# Patient Record
Sex: Female | Born: 1985 | Race: Black or African American | Hispanic: No | Marital: Single | Smoking: Former smoker
Health system: Southern US, Community
[De-identification: ages and names within clinical notes are randomized; demographics above are authoritative.]

## PROBLEM LIST (undated history)

## (undated) DIAGNOSIS — Z789 Other specified health status: Secondary | ICD-10-CM

## (undated) HISTORY — PX: TUBAL LIGATION: SHX77

---
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
## 2006-08-16 DIAGNOSIS — J309 Allergic rhinitis, unspecified: Secondary | ICD-10-CM | POA: Insufficient documentation

## 2006-08-17 ENCOUNTER — Encounter (INDEPENDENT_AMBULATORY_CARE_PROVIDER_SITE_OTHER): Payer: Self-pay | Admitting: *Deleted

## 2009-08-25 ENCOUNTER — Ambulatory Visit (HOSPITAL_COMMUNITY): Admission: RE | Admit: 2009-08-25 | Discharge: 2009-08-25 | Payer: Self-pay | Admitting: Obstetrics and Gynecology

## 2010-07-10 ENCOUNTER — Encounter: Payer: Self-pay | Admitting: Obstetrics

## 2010-11-04 NOTE — Op Note (Signed)
Sarah Murray, WENZLER NO.:  [id number]   MEDICAL RECORD NO.:  [id number]                   PATIENT TYPE:  INP   LOCATION:  9107                                 FACILITY:  [facility]   PHYSICIAN:  Conni Elliot, M.D.             DATE OF BIRTH:  [birth year]   DATE OF PROCEDURE:  08/18/2003  DATE OF DISCHARGE:  08/21/2003                                 OPERATIVE REPORT   PREOPERATIVE DIAGNOSES:  1. Repetitive variable fetal heart rate decelerations.  2. Persistent occiput posterior position.  3. Arrest of labor.   POSTOPERATIVE DIAGNOSES:  1. Repetitive variable fetal heart rate decelerations.  2. Persistent occiput posterior position.  3. Arrest of labor.   OPERATION:  Low transverse cesarean.   OPERATOR:  Conni Elliot, M.D., and Toney Rakes, M.D.   OPERATIVE FINDINGS:  Apgars of 8 and 9, cord pH of 7.34.  Placenta was sent  to pathology.   OPERATIVE PROCEDURE:  Patient supine in the left lateral tilt position,  receiving oxygen and was prepped and draped in the usual sterile fashion.  A  low transverse Pfannenstiel incision was made.  Incision was made in the  skin and subcutaneous tissue and fascia, rectus muscles separated in the  midline, peritoneal cavity entered, bladder flap created.  A low transverse  uterine incision was made.  The baby was delivered from a vertex  presentation, cord doubly clamped and cut, and the remainder of the body was  delivered after DeLee suctioning.  Cord was clamped and cut and baby handed  to neonatologist in attendance.  The placenta was delivered spontaneously.  The uterus, bladder flap, anterior  peritoneum, fascia, subcutaneous tissue,  and skin were closed in the routine fashion.  Estimated blood loss was less  than 800 mL.  The instrument count was correct.                                               Conni Elliot, M.D.    ASG/MEDQ  D:  09/17/2003  T:  09/17/2003  Job:  [id number]

## 2010-11-04 NOTE — Discharge Summary (Signed)
NAMELAFAWN, Sarah Murray                             ACCOUNT NO.:  [id number]   MEDICAL RECORD NO.:  [id number]                   PATIENT TYPE:  INP   LOCATION:  9107                                 FACILITY:  [facility]   PHYSICIAN:  Phil D. Okey Dupre, M.D.                  DATE OF BIRTH:  1985/10/05   DATE OF ADMISSION:  08/17/2003  DATE OF DISCHARGE:  08/21/2003                                 DISCHARGE SUMMARY   ADMISSION DIAGNOSIS:  A 25 year old gravida 1 at 41 weeks for induction of  labor secondary to post dates.   DISCHARGE DIAGNOSES:  1. A 25 year old gravida 1 para 1 postoperative day #3 status post lower     transverse cesarean section secondary to nonreassuring fetal heart     tracing and failure to progress.  2. Viable female with Apgars 8 at one minute and 9 at five minutes.   DISCHARGE MEDICATIONS:  1. Percocet 5/325 one to two p.o. q.4-6h. p.r.n.  2. Ibuprofen 600 mg one p.o. q.6h. p.r.n.  3. Depo-Provera 150 mg IM every 3 months.  4. Prenatal vitamins one p.o. daily.  5. Iron sulfate 325 mg one p.o. t.i.d. with meals.  6. Colace 100 mg one p.o. daily.   ADMISSION HISTORY:  Sarah Murray was a G1 who presented at 41 weeks with the  loss of her mucous plug.  She had decided that since she was post dates that  she would be admitted for induction.   HOSPITAL COURSE:  The patient was started on Cervidil.  She received two of  these with adequate cervical ripening.  She was then started on Pitocin per  low-dose protocol for augmentation.  She spontaneously ruptured at 5 cm and  the fluid was noted to be thick meconium.  An amnioinfusion was started.   Although the Pitocin was increased and the patient had adequate MVUs the  patient failed to dilate past 5-6 cm.  The fetal heart tracing began having  repetitive deep variables and it was decided that the patient should be  taken for a lower transverse C-section.  This was explained to the patient  and she was in agreement.  Please see the  operative note for that procedure.   The patient's postpartum course was uneventful.  She was noted to be anemic  with a hemoglobin of 7.9 on postoperative day #1.  She was started on iron.   The patient was in stable condition on day of discharge.  She was given Depo-  Provera injection before discharge.  Her staples were removed.  She is  breast pumping.   She will follow up with the [facility]     Maurice March, M.D.                        Phil D. Okey Dupre, M.D.   LC/MEDQ  D:  08/21/2003  T:  08/22/2003  Job:  [id number]

## 2012-07-15 ENCOUNTER — Emergency Department (HOSPITAL_BASED_OUTPATIENT_CLINIC_OR_DEPARTMENT_OTHER)
Admission: EM | Admit: 2012-07-15 | Discharge: 2012-07-15 | Disposition: A | Discharge status: Home / Self Care | Payer: Self-pay | Attending: Emergency Medicine | Admitting: Emergency Medicine

## 2012-07-15 ENCOUNTER — Encounter (HOSPITAL_BASED_OUTPATIENT_CLINIC_OR_DEPARTMENT_OTHER): Payer: Self-pay | Admitting: *Deleted

## 2012-07-15 DIAGNOSIS — N760 Acute vaginitis: Secondary | ICD-10-CM | POA: Insufficient documentation

## 2012-07-15 DIAGNOSIS — J3489 Other specified disorders of nose and nasal sinuses: Secondary | ICD-10-CM | POA: Insufficient documentation

## 2012-07-15 DIAGNOSIS — B9689 Other specified bacterial agents as the cause of diseases classified elsewhere: Secondary | ICD-10-CM

## 2012-07-15 DIAGNOSIS — Z3202 Encounter for pregnancy test, result negative: Secondary | ICD-10-CM | POA: Insufficient documentation

## 2012-07-15 DIAGNOSIS — R300 Dysuria: Secondary | ICD-10-CM | POA: Insufficient documentation

## 2012-07-15 LAB — URINE MICROSCOPIC-ADD ON

## 2012-07-15 LAB — URINALYSIS, ROUTINE W REFLEX MICROSCOPIC
Bilirubin Urine: NEGATIVE
Protein, ur: NEGATIVE mg/dL
Urobilinogen, UA: 1 mg/dL (ref 0.0–1.0)

## 2012-07-15 LAB — WET PREP, GENITAL: Trich, Wet Prep: NONE SEEN

## 2012-07-15 LAB — PREGNANCY, URINE: Preg Test, Ur: NEGATIVE

## 2012-07-15 MED ORDER — METRONIDAZOLE 500 MG PO TABS: 500.0000 mg | ORAL_TABLET | Freq: Two times a day (BID) | ORAL | Status: DC

## 2012-07-15 NOTE — ED Notes (Signed)
MD at bedside, Dr. Karma Ganja.

## 2012-07-15 NOTE — ED Notes (Signed)
Pt c/o UTi symptoms x 2 months , vaginal discharge x 1 week and sinus pressure

## 2012-07-15 NOTE — ED Notes (Signed)
MD at bedside. 

## 2012-07-15 NOTE — ED Provider Notes (Signed)
History     CSN: [id number]  Arrival date & time 07/15/12  1644   First MD Initiated Contact with Patient 07/15/12 1656      Chief Complaint  Patient presents with  . Urinary Tract Infection    (Consider location/radiation/quality/duration/timing/severity/associated sxs/prior treatment) Patient is a 27 y.o. female presenting with urinary tract infection. The history is provided by the patient.  Urinary Tract Infection This is a recurrent problem. The current episode started 1 to 4 weeks ago. The problem occurs constantly. The problem has been waxing and waning. Associated symptoms include congestion. Pertinent negatives include no abdominal pain, coughing, fatigue, fever, myalgias, nausea or rash. Nothing aggravates the symptoms. She has tried nothing for the symptoms.    History reviewed. No pertinent past medical history.  Past Surgical History  Procedure Date  . Tubal ligation   . Cesarean section     History reviewed. No pertinent family history.  History  Substance Use Topics  . Smoking status: Never Smoker   . Smokeless tobacco: Not on file  . Alcohol Use: No    OB History    Grav Para Term Preterm Abortions TAB SAB Ect Mult Living                  Review of Systems  Constitutional: Negative for fever and fatigue.  HENT: Positive for congestion, rhinorrhea and sneezing.   Respiratory: Negative for cough and shortness of breath.   Gastrointestinal: Negative for nausea and abdominal pain.  Genitourinary: Positive for dysuria and vaginal discharge (normal discharge, however has odor).  Musculoskeletal: Negative for myalgias.  Skin: Negative for rash.  All other systems reviewed and are negative.    Allergies  Review of patient's allergies indicates no known allergies.  Home Medications  No current outpatient prescriptions on file.  BP 98/66  Pulse 112  Temp 98.4 F (36.9 C) (Oral)  Resp 16  Ht 5\' 5"  (1.651 m)  Wt 173 lb (78.472 kg)  BMI 28.79  kg/m2  SpO2 100%  LMP 07/15/2012  Physical Exam  Nursing note and vitals reviewed. Constitutional: She is oriented to person, place, and time. She appears well-developed and well-nourished. No distress.  HENT:  Head: Normocephalic and atraumatic.  Mouth/Throat: No oropharyngeal exudate or posterior oropharyngeal erythema.  Eyes: EOM are normal. Pupils are equal, round, and reactive to light.  Neck: Normal range of motion. Neck supple.  Cardiovascular: Normal rate and regular rhythm.  Exam reveals no friction rub.   No murmur heard. Pulmonary/Chest: Effort normal and breath sounds normal. No respiratory distress. She has no wheezes. She has no rales.  Abdominal: Soft. She exhibits no distension. There is no tenderness. There is no rebound.  Musculoskeletal: Normal range of motion. She exhibits no edema.  Neurological: She is alert and oriented to person, place, and time.  Skin: She is not diaphoretic.    ED Course  Procedures (including critical care time)  Labs Reviewed  URINALYSIS, ROUTINE W REFLEX MICROSCOPIC - Abnormal; Notable for the following:    Hgb urine dipstick TRACE (*)     Leukocytes, UA SMALL (*)     All other components within normal limits  URINE MICROSCOPIC-ADD ON - Abnormal; Notable for the following:    Bacteria, UA FEW (*)     All other components within normal limits  PREGNANCY, URINE  URINE CULTURE   No results found.   1. Bacterial vaginosis       MDM   27 year old female presents with UTI symptoms  and URI symptoms. UTI symptoms have been present for the last 10 days. States some frequency and some dysuria. Has suprapubic pain with urination. Denies hematuria. Denies fevers or back pain. No abdominal pain or vomiting. I will check her urine. She is also complaining of some odor to her normal vaginal discharge. I will do pelvic exam. Patient also complaining of upper respiratory type symptoms. She states congestion, sneezing, rhinorrhea for the past  2 weeks. Denies any cough or fevers. Patient HEENT exam is normal. Normal TMs, normal pharynx. No nasal tendernI.ess to palpation. Patient's symptoms do a cold. Will recommend supportive care.  Urine negative for UTI. Patient has some clue cells on her wet prep. I gave Flagyl. Stable for discharge.       Elwin Mocha, MD 07/16/12 [phone]

## 2012-07-17 LAB — URINE CULTURE: Colony Count: >=100000

## 2012-07-18 ENCOUNTER — Telehealth (HOSPITAL_COMMUNITY): Payer: Self-pay | Admitting: Emergency Medicine

## 2012-07-18 NOTE — ED Notes (Signed)
Results received from [facility]. (+) URNC -> >/= 100,000 colonies, E Coli.  No abx Rx given in ED.  Chart to MD office for review.

## 2012-07-19 NOTE — ED Notes (Signed)
Chart returned from EDP office   With rx written  By Lemont Fillers for Keflex 500 mg TAB # 14 one tab po BID x 7 days needs to be called to pharmacy.

## 2012-07-19 NOTE — ED Provider Notes (Signed)
I saw and evaluated the patient, reviewed the resident's note and I agree with the findings and plan.  Martha K Linker, MD 07/19/12 1120 

## 2012-07-20 ENCOUNTER — Telehealth (HOSPITAL_COMMUNITY): Payer: Self-pay | Admitting: Emergency Medicine

## 2012-07-21 NOTE — ED Notes (Signed)
Unable to contact patient via phone. Sent letter. °

## 2012-11-24 ENCOUNTER — Encounter (HOSPITAL_BASED_OUTPATIENT_CLINIC_OR_DEPARTMENT_OTHER): Payer: Self-pay | Admitting: *Deleted

## 2012-11-24 ENCOUNTER — Emergency Department (HOSPITAL_BASED_OUTPATIENT_CLINIC_OR_DEPARTMENT_OTHER)
Admission: EM | Admit: 2012-11-24 | Discharge: 2012-11-24 | Disposition: A | Discharge status: Home / Self Care | Payer: Self-pay | Attending: Emergency Medicine | Admitting: Emergency Medicine

## 2012-11-24 DIAGNOSIS — K1370 Unspecified lesions of oral mucosa: Secondary | ICD-10-CM | POA: Insufficient documentation

## 2012-11-24 DIAGNOSIS — L237 Allergic contact dermatitis due to plants, except food: Secondary | ICD-10-CM

## 2012-11-24 DIAGNOSIS — L255 Unspecified contact dermatitis due to plants, except food: Secondary | ICD-10-CM | POA: Insufficient documentation

## 2012-11-24 DIAGNOSIS — L299 Pruritus, unspecified: Secondary | ICD-10-CM | POA: Insufficient documentation

## 2012-11-24 DIAGNOSIS — K148 Other diseases of tongue: Secondary | ICD-10-CM

## 2012-11-24 MED ORDER — PREDNISONE 10 MG PO TABS: 20.0000 mg | ORAL_TABLET | Freq: Every day | ORAL | Status: DC

## 2012-11-24 MED ORDER — PREDNISONE 50 MG PO TABS
60.0000 mg | ORAL_TABLET | Freq: Once | ORAL | Status: AC
  Administered 2012-11-24: 60 mg via ORAL
  Filled 2012-11-24: qty 1

## 2012-11-24 NOTE — ED Notes (Signed)
Patient has white bumps on her tongue and hands. She states that her hands are itching and the inside of her mouth is irritated. Denies any other symptoms. Noticed yesterday.

## 2012-11-24 NOTE — ED Provider Notes (Signed)
History     CSN: [id number]  Arrival date & time 11/24/12  1235   First MD Initiated Contact with Patient 11/24/12 1315      Chief Complaint  Patient presents with  . Rash    (Consider location/radiation/quality/duration/timing/severity/associated sxs/prior treatment) Patient is a 27 y.o. female presenting with rash.  Rash Pain location: hands. Pain radiates to:  Does not radiate Pain severity:  No pain Onset quality:  Gradual Timing:  Constant Progression:  Unchanged Chronicity:  New Context: not alcohol use, not awakening from sleep, not diet changes, not previous surgeries and not recent illness   Relieved by:  Nothing Worsened by:  Nothing tried Ineffective treatments:  None tried Associated symptoms comment:  Itches   History reviewed. No pertinent past medical history.  Past Surgical History  Procedure Laterality Date  . Tubal ligation    . Cesarean section      No family history on file.  History  Substance Use Topics  . Smoking status: Never Smoker   . Smokeless tobacco: Not on file  . Alcohol Use: No    OB History   Grav Para Term Preterm Abortions TAB SAB Ect Mult Living                  Review of Systems  Skin: Positive for rash.  All other systems reviewed and are negative.    Allergies  Review of patient's allergies indicates no known allergies.  Home Medications   Current Outpatient Rx  Name  Route  Sig  Dispense  Refill  . metroNIDAZOLE (FLAGYL) 500 MG tablet   Oral   Take 1 tablet (500 mg total) by mouth 2 (two) times daily.   14 tablet   0     BP 105/71  Pulse 72  Temp(Src) 98.3 F (36.8 C) (Oral)  Resp 18  Ht 5\' 5"  (1.651 m)  Wt 191 lb (86.637 kg)  BMI 31.78 kg/m2  SpO2 97%  LMP 11/18/2012  Physical Exam  Nursing note and vitals reviewed. Constitutional: She appears well-developed and well-nourished.  HENT:  Head: Normocephalic and atraumatic.  Clear small vesicles on tongue  Eyes: Conjunctivae are normal.  Pupils are equal, round, and reactive to light.  Neck: Normal range of motion. Neck supple.  Cardiovascular: Normal rate and regular rhythm.   Pulmonary/Chest: Effort normal and breath sounds normal.  Abdominal: Soft. Bowel sounds are normal.  Skin: Skin is warm, dry and intact. Rash noted. Rash is maculopapular. No cyanosis. Nails show no clubbing.    ED Course  Procedures (including critical care time)  Labs Reviewed - No data to display No results found.   No diagnosis found.    MDM  Patient placed on prednisone for contact dermatitis on hands.  Tongue vesicles may be viral advised f/y if not resolved.        Hilario Quarry, MD 11/25/12 [phone]

## 2012-11-24 NOTE — ED Notes (Signed)
Patient has small raised bumps on her hands that she states itches and burns. She also has tiny raised bumps along the sides of her tongue.

## 2013-02-21 ENCOUNTER — Emergency Department (HOSPITAL_BASED_OUTPATIENT_CLINIC_OR_DEPARTMENT_OTHER)
Admission: EM | Admit: 2013-02-21 | Discharge: 2013-02-21 | Disposition: A | Discharge status: Home / Self Care | Payer: Self-pay | Attending: Emergency Medicine | Admitting: Emergency Medicine

## 2013-02-21 ENCOUNTER — Encounter (HOSPITAL_BASED_OUTPATIENT_CLINIC_OR_DEPARTMENT_OTHER): Payer: Self-pay | Admitting: *Deleted

## 2013-02-21 DIAGNOSIS — Z3202 Encounter for pregnancy test, result negative: Secondary | ICD-10-CM | POA: Insufficient documentation

## 2013-02-21 DIAGNOSIS — Z9851 Tubal ligation status: Secondary | ICD-10-CM | POA: Insufficient documentation

## 2013-02-21 DIAGNOSIS — R10819 Abdominal tenderness, unspecified site: Secondary | ICD-10-CM | POA: Insufficient documentation

## 2013-02-21 DIAGNOSIS — N390 Urinary tract infection, site not specified: Secondary | ICD-10-CM | POA: Insufficient documentation

## 2013-02-21 DIAGNOSIS — R300 Dysuria: Secondary | ICD-10-CM | POA: Insufficient documentation

## 2013-02-21 LAB — URINALYSIS, ROUTINE W REFLEX MICROSCOPIC
Nitrite: NEGATIVE
Protein, ur: 100 mg/dL — AB
Urobilinogen, UA: 0.2 mg/dL (ref 0.0–1.0)

## 2013-02-21 LAB — PREGNANCY, URINE: Preg Test, Ur: NEGATIVE

## 2013-02-21 MED ORDER — SULFAMETHOXAZOLE-TRIMETHOPRIM 800-160 MG PO TABS: 1.0000 | ORAL_TABLET | Freq: Two times a day (BID) | ORAL | Status: DC

## 2013-02-21 MED ORDER — SULFAMETHOXAZOLE-TMP DS 800-160 MG PO TABS
1.0000 | ORAL_TABLET | Freq: Once | ORAL | Status: AC
  Administered 2013-02-21: 1 via ORAL
  Filled 2013-02-21: qty 1

## 2013-02-21 MED ORDER — PHENAZOPYRIDINE HCL 200 MG PO TABS: 200.0000 mg | ORAL_TABLET | Freq: Three times a day (TID) | ORAL | Status: DC

## 2013-02-21 NOTE — ED Notes (Signed)
Pt reports symptoms of uti states frequency, urgency,painful urination as well as bad odor to urine. Pt reports that the symptoms started after having sex with husband . She has no vaginal discharge, no rash or itching. Denies abdominal pain or back pain . Denies fever and chills

## 2013-02-21 NOTE — ED Provider Notes (Signed)
CSN: [id number]     Arrival date & time 02/21/13  7829 History   First MD Initiated Contact with Patient 02/21/13 [phone]     Chief Complaint  Patient presents with  . possible uti    (Consider location/radiation/quality/duration/timing/severity/associated sxs/prior Treatment) HPI Patient presents with 4 days of urinary frequency, pressure sensation in her suprapubic area, dysuria, malodorous urine. Onset was gradual.  Since onset symptoms have been persistent. Prior to the onset of symptoms, the patient had intercourse with her husband. No current vaginal bleeding or discharge, current other abdominal pain, the verge, chills, nausea, vomiting, diarrhea. No relief with anything. No clear exacerbating factors.   History reviewed. No pertinent past medical history. Past Surgical History  Procedure Laterality Date  . Tubal ligation    . Cesarean section     History reviewed. No pertinent family history. History  Substance Use Topics  . Smoking status: Never Smoker   . Smokeless tobacco: Not on file  . Alcohol Use: No   OB History   Grav Para Term Preterm Abortions TAB SAB Ect Mult Living                 Review of Systems  All other systems reviewed and are negative.    Allergies  Review of patient's allergies indicates no known allergies.  Home Medications   Current Outpatient Rx  Name  Route  Sig  Dispense  Refill  . metroNIDAZOLE (FLAGYL) 500 MG tablet   Oral   Take 1 tablet (500 mg total) by mouth 2 (two) times daily.   14 tablet   0   . predniSONE (DELTASONE) 10 MG tablet   Oral   Take 2 tablets (20 mg total) by mouth daily.   15 tablet   0    BP 116/68  Pulse 59  Temp(Src) 98.5 F (36.9 C) (Oral)  Resp 20  SpO2 100% Physical Exam  Nursing note and vitals reviewed. Constitutional: She is oriented to person, place, and time. She appears well-developed and well-nourished. No distress.  HENT:  Head: Normocephalic and atraumatic.  Eyes: Conjunctivae and  EOM are normal.  Cardiovascular: Normal rate and regular rhythm.   Pulmonary/Chest: Effort normal and breath sounds normal. No stridor. No respiratory distress.  Abdominal: She exhibits no distension. There is tenderness in the suprapubic area. There is no rigidity, no rebound and no guarding.  Musculoskeletal: She exhibits no edema.  Neurological: She is alert and oriented to person, place, and time. No cranial nerve deficit.  Skin: Skin is warm and dry.  Psychiatric: She has a normal mood and affect.    ED Course  Procedures (including critical care time) Labs Review Labs Reviewed  URINALYSIS, ROUTINE W REFLEX MICROSCOPIC  PREGNANCY, URINE   Imaging Review No results found.   Update: No acute changes during the patient's emergency department monitoring MDM  No diagnosis found. This generally well-appearing female presents in in no distress with concern of dysuria, suprapubic pressure.  Absent pregnancy, vaginal bleeding, significant unilateral lower abdominal pain, pelvic exams not performed.  Patient's laboratory results are consistent with UTI.  She was started on therapy, discharged in stable condition. Absent distress, fever, significant abdominal pain, there is little suspicion for TOA or other notable intra-abdominal pathology currently.    Gerhard Munch, MD 02/21/13 [phone]

## 2013-02-24 LAB — URINE CULTURE

## 2013-02-25 NOTE — ED Notes (Addendum)
Post ED Visit - Positive Culture Follow-up: Successful Patient Follow-Up  Culture assessed and recommendations reviewed by: [x]  Wes Dulaney, Pharm.D., BCPS []  Celedonio Miyamoto, Pharm.D., BCPS []  Georgina Pillion, Pharm.D., BCPS []  [place], [address].D., BCPS, AAHIVP []  Estella Husk, Pharm.D., BCPS, AAHIVP  Positive Urine culture  []  Patient discharged without antimicrobial prescription and treatment is now indicated [x]  Organism is resistant to prescribed ED discharge antimicrobial []  Patient with positive blood cultures  Changes discussed with ED provider:Josh Geiple New antibiotic prescription stop Bactrim/ Start Keflex 500 mg po BID x 7 days  Patient notified and wants rx called to [facility] [phone] in [facility]    Larena Sox 02/25/2013, 4:18 PM

## 2013-02-25 NOTE — Progress Notes (Signed)
  ED Antimicrobial Stewardship Positive Culture Follow Up   Sarah Murray is an 27 y.o. female who presented to [facility] on 02/21/2013 with a chief complaint of  Chief Complaint  Patient presents with  . possible uti     Recent Results (from the past 720 hour(s))  URINE CULTURE     Status: None   Collection Time    02/21/13  8:01 AM      Result Value Range Status   Specimen Description URINE, CLEAN CATCH   Final   Special Requests NONE   Final   Culture  Setup Time     Final   Value: 02/21/2013 17:35     Performed at [facility] Count     Final   Value: >=100,000 COLONIES/ML     Performed at [facility]   Culture     Final   Value: ESCHERICHIA COLI     Performed at [facility]   Report Status 02/24/2013 FINAL   Final   Organism ID, Bacteria ESCHERICHIA COLI   Final    [x]  Treated with Bactrim, organism resistant to prescribed antimicrobial []  Patient discharged originally without antimicrobial agent and treatment is now indicated  New antibiotic prescription: Keflex 500mg  PO BID x 7 days  ED Provider: Rhea Bleacher, PA-C   [name] 02/25/2013, 4:47 PM Infectious Diseases Pharmacist Phone# [phone]

## 2013-03-22 ENCOUNTER — Emergency Department (HOSPITAL_BASED_OUTPATIENT_CLINIC_OR_DEPARTMENT_OTHER)
Admission: EM | Admit: 2013-03-22 | Discharge: 2013-03-22 | Disposition: A | Discharge status: Home / Self Care | Payer: Self-pay | Attending: Emergency Medicine | Admitting: Emergency Medicine

## 2013-03-22 ENCOUNTER — Encounter (HOSPITAL_BASED_OUTPATIENT_CLINIC_OR_DEPARTMENT_OTHER): Payer: Self-pay | Admitting: Emergency Medicine

## 2013-03-22 DIAGNOSIS — N390 Urinary tract infection, site not specified: Secondary | ICD-10-CM | POA: Diagnosis present

## 2013-03-22 DIAGNOSIS — Z3202 Encounter for pregnancy test, result negative: Secondary | ICD-10-CM | POA: Insufficient documentation

## 2013-03-22 DIAGNOSIS — IMO0002 Reserved for concepts with insufficient information to code with codable children: Secondary | ICD-10-CM | POA: Insufficient documentation

## 2013-03-22 DIAGNOSIS — Z79899 Other long term (current) drug therapy: Secondary | ICD-10-CM | POA: Insufficient documentation

## 2013-03-22 DIAGNOSIS — Z792 Long term (current) use of antibiotics: Secondary | ICD-10-CM | POA: Insufficient documentation

## 2013-03-22 LAB — URINE MICROSCOPIC-ADD ON

## 2013-03-22 LAB — URINALYSIS, ROUTINE W REFLEX MICROSCOPIC
Bilirubin Urine: NEGATIVE
Ketones, ur: NEGATIVE mg/dL
Nitrite: NEGATIVE
Urobilinogen, UA: 0.2 mg/dL (ref 0.0–1.0)
pH: 6 (ref 5.0–8.0)

## 2013-03-22 LAB — PREGNANCY, URINE: Preg Test, Ur: NEGATIVE

## 2013-03-22 MED ORDER — CIPROFLOXACIN HCL 250 MG PO TABS: 250.0000 mg | ORAL_TABLET | Freq: Two times a day (BID) | ORAL | Status: DC

## 2013-03-22 NOTE — ED Notes (Signed)
Pt having dysuria x 4 days.  No back pain or fever.

## 2013-03-22 NOTE — ED Provider Notes (Signed)
CSN: [id number]     Arrival date & time 03/22/13  0810 History   First MD Initiated Contact with Patient 03/22/13 [phone]     Chief Complaint  Patient presents with  . Dysuria   (Consider location/radiation/quality/duration/timing/severity/associated sxs/prior Treatment) Patient is a 27 y.o. female presenting with dysuria. The history is provided by the patient.  Dysuria Pain quality:  Burning Pain severity:  Mild Onset quality:  Sudden Duration:  4 days Timing:  Constant Progression:  Unchanged Chronicity:  Recurrent Recent urinary tract infections: yes (2 mos ago)   Relieved by:  Antibiotics Worsened by:  Nothing tried Ineffective treatments:  None tried Urinary symptoms: frequent urination   Associated symptoms: abdominal pain   Associated symptoms: no fever, no flank pain, no nausea and no vomiting     History reviewed. No pertinent past medical history. Past Surgical History  Procedure Laterality Date  . Tubal ligation    . Cesarean section     History reviewed. No pertinent family history. History  Substance Use Topics  . Smoking status: Never Smoker   . Smokeless tobacco: Not on file  . Alcohol Use: No   OB History   Grav Para Term Preterm Abortions TAB SAB Ect Mult Living                 Review of Systems  Constitutional: Negative for fever and fatigue.  HENT: Negative for congestion, drooling and neck pain.   Eyes: Negative for pain.  Respiratory: Negative for cough and shortness of breath.   Cardiovascular: Negative for chest pain.  Gastrointestinal: Positive for abdominal pain. Negative for nausea, vomiting and diarrhea.  Genitourinary: Positive for dysuria. Negative for hematuria and flank pain.  Musculoskeletal: Negative for back pain and gait problem.  Skin: Negative for color change.  Neurological: Negative for dizziness and headaches.  Hematological: Negative for adenopathy.  Psychiatric/Behavioral: Negative for behavioral problems.  All other  systems reviewed and are negative.    Allergies  Review of patient's allergies indicates no known allergies.  Home Medications   Current Outpatient Rx  Name  Route  Sig  Dispense  Refill  . metroNIDAZOLE (FLAGYL) 500 MG tablet   Oral   Take 1 tablet (500 mg total) by mouth 2 (two) times daily.   14 tablet   0   . phenazopyridine (PYRIDIUM) 200 MG tablet   Oral   Take 1 tablet (200 mg total) by mouth 3 (three) times daily.   6 tablet   0   . predniSONE (DELTASONE) 10 MG tablet   Oral   Take 2 tablets (20 mg total) by mouth daily.   15 tablet   0    BP 104/60  Pulse 82  Temp(Src) 98.1 F (36.7 C) (Oral)  Resp 16  Ht 5\' 4"  (1.626 m)  SpO2 100%  LMP 02/20/2013 Physical Exam  Nursing note and vitals reviewed. Constitutional: She is oriented to person, place, and time. She appears well-developed and well-nourished.  HENT:  Head: Normocephalic.  Mouth/Throat: No oropharyngeal exudate.  Eyes: Conjunctivae and EOM are normal. Pupils are equal, round, and reactive to light.  Neck: Normal range of motion. Neck supple.  Cardiovascular: Normal rate, regular rhythm, normal heart sounds and intact distal pulses.  Exam reveals no gallop and no friction rub.   No murmur heard. Pulmonary/Chest: Effort normal and breath sounds normal. No respiratory distress. She has no wheezes.  Abdominal: Soft. Bowel sounds are normal. There is tenderness (mild suprapubic ttp). There is no rebound  and no guarding.  Musculoskeletal: Normal range of motion. She exhibits no edema and no tenderness.  Neurological: She is alert and oriented to person, place, and time.  Skin: Skin is warm and dry.  Psychiatric: She has a normal mood and affect. Her behavior is normal.    ED Course  Procedures (including critical care time) Labs Review Labs Reviewed  URINALYSIS, ROUTINE W REFLEX MICROSCOPIC - Abnormal; Notable for the following:    APPearance TURBID (*)    Hgb urine dipstick MODERATE (*)     Protein, ur 30 (*)    Leukocytes, UA LARGE (*)    All other components within normal limits  URINE MICROSCOPIC-ADD ON - Abnormal; Notable for the following:    Bacteria, UA MANY (*)    All other components within normal limits  URINE CULTURE  PREGNANCY, URINE   Imaging Review No results found.  MDM   1. UTI (lower urinary tract infection)    8:40 AM 27 y.o. female who presents with dysuria and frequency for approximately 4 days. She denies any fevers, vomiting, diarrhea, or back pain. She notes mild suprapubic pain. She states that her symptoms are consistent with previous urinary tract infections. She states that she gets UTIs frequently. She is afebrile and vital signs are unremarkable here. Will send urine and get urine culture as well. I suspect patient needs nonemergent ultrasound of her renal and urinary collecting system. Will provide resources to the patient to followup and get this.  9:30 AM: Pt found to have UTI, will send cx.  I have discussed the diagnosis/risks/treatment options with the patient and believe the pt to be eligible for discharge home to follow-up and establish with a pcp. We also discussed returning to the ED immediately if new or worsening sx occur. We discussed the sx which are most concerning (e.g., fever, back pain, continued sx despite tx) that necessitate immediate return. Any new prescriptions provided to the patient are listed below.  Discharge Medication List as of 03/22/2013  9:34 AM    START taking these medications   Details  ciprofloxacin (CIPRO) 250 MG tablet Take 1 tablet (250 mg total) by mouth every 12 (twelve) hours., Starting 03/22/2013, Until Discontinued, Print         Junius Argyle, MD 03/22/13 [phone]

## 2013-03-24 LAB — URINE CULTURE: Colony Count: >=100000

## 2013-12-24 ENCOUNTER — Emergency Department (HOSPITAL_BASED_OUTPATIENT_CLINIC_OR_DEPARTMENT_OTHER)
Admission: EM | Admit: 2013-12-24 | Discharge: 2013-12-25 | Disposition: A | Discharge status: Home / Self Care | Payer: Self-pay | Attending: Emergency Medicine | Admitting: Emergency Medicine

## 2013-12-24 ENCOUNTER — Encounter (HOSPITAL_BASED_OUTPATIENT_CLINIC_OR_DEPARTMENT_OTHER): Payer: Self-pay | Admitting: Emergency Medicine

## 2013-12-24 DIAGNOSIS — Z792 Long term (current) use of antibiotics: Secondary | ICD-10-CM | POA: Insufficient documentation

## 2013-12-24 DIAGNOSIS — A598 Trichomoniasis of other sites: Secondary | ICD-10-CM | POA: Insufficient documentation

## 2013-12-24 DIAGNOSIS — F17200 Nicotine dependence, unspecified, uncomplicated: Secondary | ICD-10-CM | POA: Insufficient documentation

## 2013-12-24 DIAGNOSIS — A599 Trichomoniasis, unspecified: Secondary | ICD-10-CM

## 2013-12-24 DIAGNOSIS — IMO0002 Reserved for concepts with insufficient information to code with codable children: Secondary | ICD-10-CM | POA: Insufficient documentation

## 2013-12-24 DIAGNOSIS — Z3202 Encounter for pregnancy test, result negative: Secondary | ICD-10-CM | POA: Insufficient documentation

## 2013-12-24 DIAGNOSIS — N390 Urinary tract infection, site not specified: Secondary | ICD-10-CM | POA: Insufficient documentation

## 2013-12-24 DIAGNOSIS — Z79899 Other long term (current) drug therapy: Secondary | ICD-10-CM | POA: Insufficient documentation

## 2013-12-24 LAB — URINALYSIS, ROUTINE W REFLEX MICROSCOPIC
Glucose, UA: NEGATIVE mg/dL
Ketones, ur: 15 mg/dL — AB
NITRITE: NEGATIVE
Specific Gravity, Urine: 1.029 (ref 1.005–1.030)
UROBILINOGEN UA: 0.2 mg/dL (ref 0.0–1.0)
pH: 6 (ref 5.0–8.0)

## 2013-12-24 LAB — URINE MICROSCOPIC-ADD ON

## 2013-12-24 NOTE — ED Notes (Signed)
Pt reports that she started menstral cycle today, developed pain with urination, but states that she feels like her "insides are itching". Recently seen at [facility] for uri and given amoxicillian which patient states she just completed

## 2013-12-24 NOTE — ED Provider Notes (Signed)
CSN: [id number]     Arrival date & time 12/24/13  2309 History  This chart was scribed for Sarah Murray Strummer Canipe, MD by Nicholos Johnsenise Iheanachor, ED scribe. This patient was seen in room MH02/MH02 and the patient's care was started at 11:40 PM.     Chief Complaint  Patient presents with  . Dysuria   HPI HPI Comments: Sarah Murray is a 28 y.o. female w/ hx of bladder infection and UTI presents to the Emergency Department complaining of white vaginal discharge w/ odor, urinary urgency, and vaginal itching. Reports normal white vaginal discharge but states odor started 2 days ago which is abnormal. Also states she feels like her bladder is always full and cannot completely empty. Menstrual cycle started 24 hours ago; normal and on time. Pt is sexually active; does not use condoms. Pt has had 4 pregnancies; 1 still birth, 3 cesarean sections. Denies fever, chills, nausea, vomiting, or dysuria.  History reviewed. No pertinent past medical history. Past Surgical History  Procedure Laterality Date  . Tubal ligation    . Cesarean section     History reviewed. No pertinent family history. History  Substance Use Topics  . Smoking status: Current Every Day Smoker    Types: Cigarettes  . Smokeless tobacco: Not on file  . Alcohol Use: No   OB History   Grav Para Term Preterm Abortions TAB SAB Ect Mult Living                 Review of Systems  Constitutional: Negative for fever and chills.  Gastrointestinal: Negative for nausea and vomiting.  Genitourinary: Positive for urgency, frequency, vaginal bleeding and vaginal discharge. Negative for dysuria, flank pain, difficulty urinating, menstrual problem and pelvic pain.  Musculoskeletal: Negative for back pain and myalgias.  Skin: Negative for rash and wound.  All other systems reviewed and are negative.  Allergies  Review of patient's allergies indicates no known allergies.  Home Medications   Prior to Admission medications   Medication Sig Start Date  End Date Taking? Authorizing Provider  phenazopyridine (PYRIDIUM) 200 MG tablet Take 1 tablet (200 mg total) by mouth 3 (three) times daily. 02/21/13  Yes Gerhard Munchobert Lockwood, MD  ciprofloxacin (CIPRO) 250 MG tablet Take 1 tablet (250 mg total) by mouth every 12 (twelve) hours. 03/22/13   Junius ArgyleForrest S Harrison, MD  metroNIDAZOLE (FLAGYL) 500 MG tablet Take 1 tablet (500 mg total) by mouth 2 (two) times daily. 07/15/12   Dagmar HaitWilliam Blair Walden, MD  predniSONE (DELTASONE) 10 MG tablet Take 2 tablets (20 mg total) by mouth daily. 11/24/12   Hilario Quarryanielle S Ray, MD   Triage vitals: BP 117/84  Pulse 61  Temp(Src) 98.1 F (36.7 C) (Oral)  Resp 18  Ht 5\' 4"  (1.626 m)  Wt 165 lb (74.844 kg)  BMI 28.31 kg/m2  SpO2 100%  LMP 12/22/2013  Physical Exam  Nursing note and vitals reviewed. Constitutional: She is oriented to person, place, and time. She appears well-developed and well-nourished. No distress.  HENT:  Head: Normocephalic and atraumatic.  Mouth/Throat: Oropharynx is clear and moist.  Eyes: Conjunctivae and EOM are normal. Pupils are equal, round, and reactive to light.  Neck: Normal range of motion. Neck supple. No tracheal deviation present.  Cardiovascular: Normal rate and regular rhythm.   Pulmonary/Chest: Effort normal and breath sounds normal. No respiratory distress. She has no wheezes. She has no rales.  Abdominal: Soft. Bowel sounds are normal. She exhibits no distension and no mass. There is no tenderness. There is  no rebound and no guarding.  Genitourinary: No vaginal discharge found.  Blood in vaginal vault. No appreciated discharge. No cervical motion tenderness.  Musculoskeletal: Normal range of motion. She exhibits no edema and no tenderness.  No CVA tenderness.  Neurological: She is alert and oriented to person, place, and time.  Skin: Skin is warm and dry. No rash noted. No erythema.  Psychiatric: She has a normal mood and affect. Her behavior is normal.    ED Course  Procedures  (including critical care time) DIAGNOSTIC STUDIES: Oxygen Saturation is 100% on room air, normal by my interpretation.    COORDINATION OF CARE: At 11:44 PM: Discussed treatment plan with patient which includes pelvic exam. Patient agrees.   Labs Review Labs Reviewed  URINALYSIS, ROUTINE W REFLEX MICROSCOPIC    Imaging Review No results found.   EKG Interpretation None      MDM   Final diagnoses:  None    I personally performed the services described in this documentation, which was scribed in my presence. The recorded information has been reviewed and is accurate.   Will treat for UTI and trichomonal infection. Patient advised to followup with the health department. Term precautions given.  Sarah Murray Salwa Bai, MD 12/25/13 [phone]

## 2013-12-25 LAB — HIV ANTIBODY (ROUTINE TESTING W REFLEX): HIV: NONREACTIVE

## 2013-12-25 LAB — WET PREP, GENITAL
CLUE CELLS WET PREP: NONE SEEN
WBC WET PREP: NONE SEEN
Yeast Wet Prep HPF POC: NONE SEEN

## 2013-12-25 LAB — PREGNANCY, URINE: Preg Test, Ur: NEGATIVE

## 2013-12-25 LAB — GC/CHLAMYDIA PROBE AMP
CT Probe RNA: NEGATIVE
GC Probe RNA: NEGATIVE

## 2013-12-25 MED ORDER — CIPROFLOXACIN HCL 500 MG PO TABS
500.0000 mg | ORAL_TABLET | Freq: Once | ORAL | Status: AC
Start: 2013-12-25 — End: 2013-12-25
  Administered 2013-12-25: 500 mg via ORAL
  Filled 2013-12-25: qty 1

## 2013-12-25 MED ORDER — METRONIDAZOLE 500 MG PO TABS: 500.0000 mg | ORAL_TABLET | Freq: Two times a day (BID) | ORAL | Status: DC

## 2013-12-25 MED ORDER — CIPROFLOXACIN HCL 250 MG PO TABS: 250.0000 mg | ORAL_TABLET | Freq: Two times a day (BID) | ORAL | Status: DC

## 2013-12-25 MED ORDER — PHENAZOPYRIDINE HCL 200 MG PO TABS: 200.0000 mg | ORAL_TABLET | Freq: Three times a day (TID) | ORAL | Status: DC | PRN

## 2013-12-25 NOTE — Discharge Instructions (Signed)
Trichomoniasis °Trichomoniasis is an infection caused by an organism called Trichomonas. The infection can affect both women and men. In women, the outer female genitalia and the vagina are affected. In men, the penis is mainly affected, but the prostate and other reproductive organs can also be involved. Trichomoniasis is a sexually transmitted infection (STI) and is most often passed to another person through sexual contact.  °RISK FACTORS °· Having unprotected sexual intercourse. °· Having sexual intercourse with an infected partner. °SIGNS AND SYMPTOMS  °Symptoms of trichomoniasis in women include: °· Abnormal gray-green frothy vaginal discharge. °· Itching and irritation of the vagina. °· Itching and irritation of the area outside the vagina. °Symptoms of trichomoniasis in men include:  °· Penile discharge with or without pain. °· Pain during urination. This results from inflammation of the urethra. °DIAGNOSIS  °Trichomoniasis may be found during a Pap test or physical exam. Your health care provider may use one of the following methods to help diagnose this infection: °· Examining vaginal discharge under a microscope. For men, urethral discharge would be examined. °· Testing the pH of the vagina with a test tape. °· Using a vaginal swab test that checks for the Trichomonas organism. A test is available that provides results within a few minutes. °· Doing a culture test for the organism. This is not usually needed. °TREATMENT  °· You may be given medicine to fight the infection. Women should inform their health care provider if they could be or are pregnant. Some medicines used to treat the infection should not be taken during pregnancy. °· Your health care provider may recommend over-the-counter medicines or creams to decrease itching or irritation. °· Your sexual partner will need to be treated if infected. °HOME CARE INSTRUCTIONS  °· Take all medicine prescribed by your health care provider. °· Take  over-the-counter medicine for itching or irritation as directed by your health care provider. °· Do not have sexual intercourse while you have the infection. °· Women should not douche or wear tampons while they have the infection. °· Discuss your infection with your partner. Your partner may have gotten the infection from you, or you may have gotten it from your partner. °· Have your sex partner get examined and treated if necessary. °· Practice safe, informed, and protected sex. °· See your health care provider for other STI testing. °SEEK MEDICAL CARE IF:  °· You still have symptoms after you finish your medicine. °· You develop abdominal pain. °· You have pain when you urinate. °· You have bleeding after sexual intercourse. °· You develop a rash. °· Your medicine makes you sick or makes you throw up (vomit). °Document Released: 11/29/2000 Document Revised: 06/10/2013 Document Reviewed: 03/17/2013 °ExitCare® Patient Information ©2015 [employer]. This information is not intended to replace advice given to you by your health care provider. Make sure you discuss any questions you have with your health care provider. °Urinary Tract Infection °Urinary tract infections (UTIs) can develop anywhere along your urinary tract. Your urinary tract is your body's drainage system for removing wastes and extra water. Your urinary tract includes two kidneys, two ureters, a bladder, and a urethra. Your kidneys are a pair of bean-shaped organs. Each kidney is about the size of your fist. They are located below your ribs, one on each side of your spine. °CAUSES °Infections are caused by microbes, which are microscopic organisms, including fungi, viruses, and bacteria. These organisms are so small that they can only be seen through a microscope. Bacteria are   the microbes that most commonly cause UTIs. °SYMPTOMS  °Symptoms of UTIs may vary by age and gender of the patient and by the location of the infection. Symptoms in young  women typically include a frequent and intense urge to urinate and a painful, burning feeling in the bladder or urethra during urination. Older women and men are more likely to be tired, shaky, and weak and have muscle aches and abdominal pain. A fever may mean the infection is in your kidneys. Other symptoms of a kidney infection include pain in your back or sides below the ribs, nausea, and vomiting. °DIAGNOSIS °To diagnose a UTI, your caregiver will ask you about your symptoms. Your caregiver also will ask to provide a urine sample. The urine sample will be tested for bacteria and white blood cells. White blood cells are made by your body to help fight infection. °TREATMENT  °Typically, UTIs can be treated with medication. Because most UTIs are caused by a bacterial infection, they usually can be treated with the use of antibiotics. The choice of antibiotic and length of treatment depend on your symptoms and the type of bacteria causing your infection. °HOME CARE INSTRUCTIONS °· If you were prescribed antibiotics, take them exactly as your caregiver instructs you. Finish the medication even if you feel better after you have only taken some of the medication. °· Drink enough water and fluids to keep your urine clear or pale yellow. °· Avoid caffeine, tea, and carbonated beverages. They tend to irritate your bladder. °· Empty your bladder often. Avoid holding urine for long periods of time. °· Empty your bladder before and after sexual intercourse. °· After a bowel movement, women should cleanse from front to back. Use each tissue only once. °SEEK MEDICAL CARE IF:  °· You have back pain. °· You develop a fever. °· Your symptoms do not begin to resolve within 3 days. °SEEK IMMEDIATE MEDICAL CARE IF:  °· You have severe back pain or lower abdominal pain. °· You develop chills. °· You have nausea or vomiting. °· You have continued burning or discomfort with urination. °MAKE SURE YOU:  °· Understand these  instructions. °· Will watch your condition. °· Will get help right away if you are not doing well or get worse. °Document Released: 03/15/2005 Document Revised: 12/05/2011 Document Reviewed: 07/14/2011 °ExitCare® Patient Information ©2015 [employer]. This information is not intended to replace advice given to you by your health care provider. Make sure you discuss any questions you have with your health care provider. ° °

## 2015-02-13 ENCOUNTER — Encounter (HOSPITAL_COMMUNITY): Payer: Self-pay | Admitting: *Deleted

## 2015-02-13 ENCOUNTER — Inpatient Hospital Stay (HOSPITAL_COMMUNITY)
Admission: AD | Admit: 2015-02-13 | Discharge: 2015-02-13 | Disposition: A | Discharge status: Home / Self Care | Payer: Self-pay | Source: Ambulatory Visit | Attending: Obstetrics and Gynecology | Admitting: Obstetrics and Gynecology

## 2015-02-13 DIAGNOSIS — F17290 Nicotine dependence, other tobacco product, uncomplicated: Secondary | ICD-10-CM | POA: Insufficient documentation

## 2015-02-13 DIAGNOSIS — N390 Urinary tract infection, site not specified: Secondary | ICD-10-CM

## 2015-02-13 HISTORY — DX: Other specified health status: Z78.9

## 2015-02-13 LAB — CBC
HCT: 30.8 % — ABNORMAL LOW (ref 36.0–46.0)
HEMOGLOBIN: 9.8 g/dL — AB (ref 12.0–15.0)
MCH: 24.4 pg — ABNORMAL LOW (ref 26.0–34.0)
MCHC: 31.8 g/dL (ref 30.0–36.0)
MCV: 76.6 fL — ABNORMAL LOW (ref 78.0–100.0)
PLATELETS: 240 10*3/uL (ref 150–400)
RBC: 4.02 MIL/uL (ref 3.87–5.11)
RDW: 16 % — AB (ref 11.5–15.5)
WBC: 6 10*3/uL (ref 4.0–10.5)

## 2015-02-13 LAB — URINALYSIS, ROUTINE W REFLEX MICROSCOPIC
BILIRUBIN URINE: NEGATIVE
Glucose, UA: NEGATIVE mg/dL
Hgb urine dipstick: NEGATIVE
Ketones, ur: NEGATIVE mg/dL
NITRITE: NEGATIVE
PH: 5.5 (ref 5.0–8.0)
Protein, ur: NEGATIVE mg/dL
Urobilinogen, UA: 1 mg/dL (ref 0.0–1.0)

## 2015-02-13 LAB — URINE MICROSCOPIC-ADD ON

## 2015-02-13 LAB — WET PREP, GENITAL
Trich, Wet Prep: NONE SEEN
WBC WET PREP: NONE SEEN
YEAST WET PREP: NONE SEEN

## 2015-02-13 LAB — HIV ANTIBODY (ROUTINE TESTING W REFLEX): HIV SCREEN 4TH GENERATION: NONREACTIVE

## 2015-02-13 LAB — POCT PREGNANCY, URINE: Preg Test, Ur: NEGATIVE

## 2015-02-13 MED ORDER — PHENAZOPYRIDINE HCL 95 MG PO TABS: 95.0000 mg | ORAL_TABLET | Freq: Three times a day (TID) | ORAL | Status: DC | PRN

## 2015-02-13 MED ORDER — KETOROLAC TROMETHAMINE 60 MG/2ML IM SOLN
60.0000 mg | Freq: Once | INTRAMUSCULAR | Status: AC
  Administered 2015-02-13: 60 mg via INTRAMUSCULAR
  Filled 2015-02-13: qty 2

## 2015-02-13 MED ORDER — SULFAMETHOXAZOLE-TRIMETHOPRIM 800-160 MG PO TABS
1.0000 | ORAL_TABLET | Freq: Once | ORAL | Status: AC
  Administered 2015-02-13: 1 via ORAL
  Filled 2015-02-13: qty 1

## 2015-02-13 MED ORDER — SULFAMETHOXAZOLE-TRIMETHOPRIM 800-160 MG PO TABS: 1.0000 | ORAL_TABLET | Freq: Two times a day (BID) | ORAL | Status: AC

## 2015-02-13 NOTE — MAU Note (Signed)
Pt stated she has been having pain in her left flank area and back. No pain or burning with urination reported.

## 2015-02-13 NOTE — MAU Provider Note (Signed)
History     CSN: [id number]  Arrival date and time: 02/13/15 0025   First Provider Initiated Contact with Patient 02/13/15 0123      Chief Complaint  Patient presents with  . Flank Pain   HPI  [name] is a 29 y.o. female [phone] presenting to MAU with Left Flank pain for 1 week. The pain radiates to the left side of her abdomen; the pain is currently constant. She has not taken anything for the pain; she currently rates her pain 10/10. She has not tried anything over the counter for the pain.   She has had UTI's in the past and this pain feels very similar.  She denies fever.    OB History    Gravida Para Term Preterm AB TAB SAB Ectopic Multiple Living   4 3 3       3       Past Medical History  Diagnosis Date  . Medical history non-contributory     Past Surgical History  Procedure Laterality Date  . Tubal ligation    . Cesarean section      History reviewed. No pertinent family history.  Social History  Substance Use Topics  . Smoking status: Current Every Day Smoker    Types: Cigars  . Smokeless tobacco: None  . Alcohol Use: No    Allergies: No Known Allergies  Prescriptions prior to admission  Medication Sig Dispense Refill Last Dose  . ciprofloxacin (CIPRO) 250 MG tablet Take 1 tablet (250 mg total) by mouth every 12 (twelve) hours. 6 tablet 0   . ciprofloxacin (CIPRO) 250 MG tablet Take 1 tablet (250 mg total) by mouth every 12 (twelve) hours. 10 tablet 0   . metroNIDAZOLE (FLAGYL) 500 MG tablet Take 1 tablet (500 mg total) by mouth 2 (two) times daily. 14 tablet 0   . metroNIDAZOLE (FLAGYL) 500 MG tablet Take 1 tablet (500 mg total) by mouth 2 (two) times daily. One po bid x 7 days 14 tablet 0   . phenazopyridine (PYRIDIUM) 200 MG tablet Take 1 tablet (200 mg total) by mouth 3 (three) times daily. 6 tablet 0 12/24/2013 at Unknown time  . phenazopyridine (PYRIDIUM) 200 MG tablet Take 1 tablet (200 mg total) by mouth 3 (three) times daily as needed  for pain. 10 tablet 0   . predniSONE (DELTASONE) 10 MG tablet Take 2 tablets (20 mg total) by mouth daily. 15 tablet 0    Results for orders placed or performed during the hospital encounter of 02/13/15 (from the past 48 hour(s))  Pregnancy, urine POC     Status: None   Collection Time: 02/13/15  1:08 AM  Result Value Ref Range   Preg Test, Ur NEGATIVE NEGATIVE    Comment:        THE SENSITIVITY OF THIS METHODOLOGY IS >24 mIU/mL    No results found for this or any previous visit (from the past 72 hour(s)).  Review of Systems  Constitutional: Negative for fever and chills.  Gastrointestinal: Positive for abdominal pain and constipation (Hard stools. ).  Genitourinary: Positive for urgency, frequency and flank pain. Negative for dysuria and hematuria.   Physical Exam   Blood pressure 109/67, pulse 94, temperature 98 F (36.7 C), temperature source Oral, resp. rate 18, height 5' 5.25" (1.657 m), last menstrual period 01/17/2015.  Physical Exam  Constitutional: She is oriented to person, place, and time. She appears well-developed and well-nourished. No distress.  HENT:  Head: Normocephalic.  Eyes: Pupils are  equal, round, and reactive to light.  Neck: Neck supple.  GI: Soft. Normal appearance and bowel sounds are normal. There is tenderness in the periumbilical area, suprapubic area and left lower quadrant. There is CVA tenderness (Left ). There is no rigidity, no rebound and no guarding.  Genitourinary:  Speculum exam: Vagina - Small amount of thick white discharge, no odor Cervix - No contact bleeding Bimanual exam: Cervix closed Uterus non tender, normal size Adnexa non tender, no masses bilaterally GC/Chlam, wet prep done Chaperone present for exam.  Musculoskeletal: Normal range of motion.  Neurological: She is alert and oriented to person, place, and time.  Skin: Skin is warm. She is not diaphoretic.  Psychiatric: She has a normal mood and affect.    MAU Course   Procedures  MDM + UA  Toradol 60 mg IM First dose of bactrim given in MAU  Wet prep GC HIV   Assessment and Plan   A:  1. UTI (lower urinary tract infection)    P:  Discharge home in stable condition RX: Bactrim Return to MAU with fever, increased pain.     Duane Lope, NP 02/13/2015 1:25 AM

## 2015-02-15 LAB — GC/CHLAMYDIA PROBE AMP (~~LOC~~) NOT AT ARMC
CHLAMYDIA, DNA PROBE: NEGATIVE
NEISSERIA GONORRHEA: NEGATIVE

## 2015-05-18 ENCOUNTER — Emergency Department (HOSPITAL_BASED_OUTPATIENT_CLINIC_OR_DEPARTMENT_OTHER)
Admission: EM | Admit: 2015-05-18 | Discharge: 2015-05-19 | Disposition: A | Discharge status: Home / Self Care | Payer: Self-pay | Attending: Emergency Medicine | Admitting: Emergency Medicine

## 2015-05-18 ENCOUNTER — Encounter (HOSPITAL_BASED_OUTPATIENT_CLINIC_OR_DEPARTMENT_OTHER): Payer: Self-pay | Admitting: *Deleted

## 2015-05-18 ENCOUNTER — Emergency Department (HOSPITAL_BASED_OUTPATIENT_CLINIC_OR_DEPARTMENT_OTHER): Payer: Self-pay

## 2015-05-18 DIAGNOSIS — N390 Urinary tract infection, site not specified: Secondary | ICD-10-CM | POA: Insufficient documentation

## 2015-05-18 DIAGNOSIS — R42 Dizziness and giddiness: Secondary | ICD-10-CM | POA: Insufficient documentation

## 2015-05-18 DIAGNOSIS — R519 Headache, unspecified: Secondary | ICD-10-CM

## 2015-05-18 DIAGNOSIS — Z9851 Tubal ligation status: Secondary | ICD-10-CM | POA: Insufficient documentation

## 2015-05-18 DIAGNOSIS — R51 Headache: Secondary | ICD-10-CM | POA: Insufficient documentation

## 2015-05-18 DIAGNOSIS — M791 Myalgia, unspecified site: Secondary | ICD-10-CM

## 2015-05-18 DIAGNOSIS — R079 Chest pain, unspecified: Secondary | ICD-10-CM | POA: Insufficient documentation

## 2015-05-18 DIAGNOSIS — F17210 Nicotine dependence, cigarettes, uncomplicated: Secondary | ICD-10-CM | POA: Insufficient documentation

## 2015-05-18 DIAGNOSIS — Z3202 Encounter for pregnancy test, result negative: Secondary | ICD-10-CM | POA: Insufficient documentation

## 2015-05-18 DIAGNOSIS — Z9889 Other specified postprocedural states: Secondary | ICD-10-CM | POA: Insufficient documentation

## 2015-05-18 LAB — CBC WITH DIFFERENTIAL/PLATELET
BASOS ABS: 0 10*3/uL (ref 0.0–0.1)
Basophils Relative: 0 %
EOS ABS: 0.1 10*3/uL (ref 0.0–0.7)
EOS PCT: 2 %
HCT: 29.5 % — ABNORMAL LOW (ref 36.0–46.0)
Hemoglobin: 9.2 g/dL — ABNORMAL LOW (ref 12.0–15.0)
LYMPHS ABS: 1.2 10*3/uL (ref 0.7–4.0)
Lymphocytes Relative: 24 %
MCH: 23.1 pg — AB (ref 26.0–34.0)
MCHC: 31.2 g/dL (ref 30.0–36.0)
MCV: 74.1 fL — ABNORMAL LOW (ref 78.0–100.0)
MONO ABS: 0.6 10*3/uL (ref 0.1–1.0)
Monocytes Relative: 13 %
Neutro Abs: 2.9 10*3/uL (ref 1.7–7.7)
Neutrophils Relative %: 61 %
PLATELETS: 213 10*3/uL (ref 150–400)
RBC: 3.98 MIL/uL (ref 3.87–5.11)
RDW: 16.5 % — AB (ref 11.5–15.5)
WBC: 4.8 10*3/uL (ref 4.0–10.5)

## 2015-05-18 LAB — URINALYSIS, ROUTINE W REFLEX MICROSCOPIC
Bilirubin Urine: NEGATIVE
Glucose, UA: NEGATIVE mg/dL
Hgb urine dipstick: NEGATIVE
Ketones, ur: NEGATIVE mg/dL
NITRITE: POSITIVE — AB
PH: 6.5 (ref 5.0–8.0)
Protein, ur: NEGATIVE mg/dL
SPECIFIC GRAVITY, URINE: 1.013 (ref 1.005–1.030)

## 2015-05-18 LAB — CBG MONITORING, ED: GLUCOSE-CAPILLARY: 93 mg/dL (ref 65–99)

## 2015-05-18 LAB — URINE MICROSCOPIC-ADD ON: RBC / HPF: NONE SEEN RBC/hpf (ref 0–5)

## 2015-05-18 LAB — PREGNANCY, URINE: Preg Test, Ur: NEGATIVE

## 2015-05-18 MED ORDER — KETOROLAC TROMETHAMINE 60 MG/2ML IM SOLN
60.0000 mg | Freq: Once | INTRAMUSCULAR | Status: AC
  Administered 2015-05-18: 60 mg via INTRAMUSCULAR
  Filled 2015-05-18: qty 2

## 2015-05-18 MED ORDER — SODIUM CHLORIDE 0.9 % IV BOLUS (SEPSIS)
1000.0000 mL | Freq: Once | INTRAVENOUS | Status: AC
  Administered 2015-05-18: 1000 mL via INTRAVENOUS

## 2015-05-18 MED ORDER — MAGNESIUM SULFATE 50 % IJ SOLN
INTRAMUSCULAR | Status: AC
  Filled 2015-05-18: qty 2

## 2015-05-18 MED ORDER — PROCHLORPERAZINE EDISYLATE 5 MG/ML IJ SOLN
10.0000 mg | Freq: Four times a day (QID) | INTRAMUSCULAR | Status: DC | PRN
  Administered 2015-05-18: 10 mg via INTRAVENOUS
  Filled 2015-05-18: qty 2

## 2015-05-18 MED ORDER — DIPHENHYDRAMINE HCL 50 MG/ML IJ SOLN
25.0000 mg | Freq: Once | INTRAMUSCULAR | Status: AC
  Administered 2015-05-18: via INTRAVENOUS
  Filled 2015-05-18: qty 1

## 2015-05-18 MED ORDER — MAGNESIUM SULFATE IN D5W 10-5 MG/ML-% IV SOLN
1.0000 g | Freq: Once | INTRAVENOUS | Status: AC
  Administered 2015-05-18: 1 g via INTRAVENOUS
  Filled 2015-05-18: qty 100

## 2015-05-18 MED ORDER — CEFTRIAXONE SODIUM 1 G IJ SOLR
1.0000 g | Freq: Once | INTRAMUSCULAR | Status: AC
  Administered 2015-05-18: 1 g via INTRAMUSCULAR
  Filled 2015-05-18: qty 10

## 2015-05-18 NOTE — ED Notes (Signed)
MD at bedside. 

## 2015-05-18 NOTE — ED Notes (Signed)
Pt c/o h/a x 3 days with dizziness today c/o bil breast pain

## 2015-05-18 NOTE — ED Provider Notes (Signed)
CSN: [id number]     Arrival date & time 05/18/15  2211 History  By signing my name below, I, Phillis Haggis, attest that this documentation has been prepared under the direction and in the presence of Shon Baton, MD. Electronically Signed: Phillis Haggis, ED Scribe. 05/18/2015. 11:07 PM.  Chief Complaint  Patient presents with  . Headache   The history is provided by the patient. No language interpreter was used.  HPI Comments: Sarah Murray is a 29 y.o. Female with a hx of chronic UTI who presents to the Emergency Department complaining of constant, gradually worsening headache onset 3 days ago. She currently rates her pain 8/10. Pt reports associated dizziness, lightheadedness, and chest pain that began today. Pt is also complaining of bilateral breast pain. Pt reports taking ibuprofen to no relief. She denies recent long travel, fever, nausea, dysuria, hematuria, or back pain. Pt reports hx of tubal ligation. She denies allergies to medications.   Past Medical History  Diagnosis Date  . Medical history non-contributory    Past Surgical History  Procedure Laterality Date  . Tubal ligation    . Cesarean section     History reviewed. No pertinent family history. Social History  Substance Use Topics  . Smoking status: Current Every Day Smoker    Types: Cigars  . Smokeless tobacco: None  . Alcohol Use: No   OB History    Gravida Para Term Preterm AB TAB SAB Ectopic Multiple Living   Review of Systems  Constitutional: Negative for fever and chills.  Cardiovascular: Positive for chest pain (bilateral breast pain). Negative for leg swelling.  Gastrointestinal: Negative for nausea and vomiting.  Genitourinary: Negative for dysuria and hematuria.  Musculoskeletal: Positive for myalgias.  Neurological: Positive for dizziness and headaches.  All other systems reviewed and are negative.  Allergies  Review of patient's allergies indicates no known  allergies.  Home Medications   Prior to Admission medications   Medication Sig Start Date End Date Taking? Authorizing Provider  cephALEXin (KEFLEX) 500 MG capsule Take 1 capsule (500 mg total) by mouth 2 (two) times daily. 05/19/15   Shon Baton, MD   BP 93/65 mmHg  Pulse 79  Temp(Src) 98.7 F (37.1 C)  Resp 16  Wt 158 lb (71.668 kg)  SpO2 100%  LMP 04/19/2015 Physical Exam  Constitutional: She is oriented to person, place, and time. She appears well-developed and well-nourished. No distress.  HENT:  Head: Normocephalic and atraumatic.  Eyes: Pupils are equal, round, and reactive to light.  Neck: Neck supple.  Cardiovascular: Normal rate, regular rhythm and normal heart sounds.   No murmur heard. Pulmonary/Chest: Effort normal and breath sounds normal. No respiratory distress. She has no wheezes. She exhibits tenderness.  Abdominal: Soft. Bowel sounds are normal. There is no tenderness. There is no rebound.  Genitourinary:  No CVA tenderness  Neurological: She is alert and oriented to person, place, and time.  Cranial nerves II through XII intact, 5 out of 5 strength in all 4 extremities, normal gait  Skin: Skin is warm and dry.  Psychiatric: She has a normal mood and affect.  Nursing note and vitals reviewed.   ED Course  Procedures (including critical care time) DIAGNOSTIC STUDIES: Oxygen Saturation is 100% on RA, normal by my interpretation.    COORDINATION OF CARE: 11:06 PM-Discussed treatment plan which includes x-ray and labs with pt at bedside and pt agreed to  plan.    Labs Review Labs Reviewed  URINALYSIS, ROUTINE W REFLEX MICROSCOPIC (NOT [facility]) - Abnormal; Notable for the following:    APPearance CLOUDY (*)    Nitrite POSITIVE (*)    Leukocytes, UA MODERATE (*)    All other components within normal limits  URINE MICROSCOPIC-ADD ON - Abnormal; Notable for the following:    Squamous Epithelial / LPF 0-5 (*)    Bacteria, UA MANY (*)    All other  components within normal limits  CBC WITH DIFFERENTIAL/PLATELET - Abnormal; Notable for the following:    Hemoglobin 9.2 (*)    HCT 29.5 (*)    MCV 74.1 (*)    MCH 23.1 (*)    RDW 16.5 (*)    All other components within normal limits  BASIC METABOLIC PANEL - Abnormal; Notable for the following:    Sodium 134 (*)    Glucose, Bld 103 (*)    Anion gap 4 (*)    All other components within normal limits  URINE CULTURE  PREGNANCY, URINE  CBG MONITORING, ED    Imaging Review Dg Chest 2 View  05/18/2015  CLINICAL DATA:  Chest pain for 3 days, pleuritic. EXAM: CHEST  2 VIEW COMPARISON:  None. FINDINGS: The heart size and mediastinal contours are within normal limits. Both lungs are clear. The visualized skeletal structures are unremarkable. IMPRESSION: No active cardiopulmonary disease. Electronically Signed   By: Ellery Plunkaniel R Mitchell M.D.   On: 05/18/2015 23:32   I have personally reviewed and evaluated these images and lab results as part of my medical decision-making.   EKG Interpretation None      MDM   Final diagnoses:  UTI (lower urinary tract infection)  Acute nonintractable headache, unspecified headache type  Myalgia    Patient presents with headache, myalgias. Nontoxic on exam. Neurologically intact. Afebrile. No signs or symptoms of meningitis. Patient does have evidence of urinary tract infection.  Urine culture sent patient given IM Rocephin. Chest pain is reproducible and likely related to myalgias. Chest x-ray is negative. No evidence of leukocytosis. Patient was given a headache cocktail with improvement of her symptoms.  Will treat her urinary tract infection. Patient was given strict return precautions.  After history, exam, and medical workup I feel the patient has been appropriately medically screened and is safe for discharge home. Pertinent diagnoses were discussed with the patient. Patient was given return precautions.  I personally performed the services  described in this documentation, which was scribed in my presence. The recorded information has been reviewed and is accurate.    Shon Batonourtney F Charlene Cowdrey, MD 05/19/15 [phone]

## 2015-05-19 LAB — BASIC METABOLIC PANEL
Anion gap: 4 — ABNORMAL LOW (ref 5–15)
BUN: 7 mg/dL (ref 6–20)
CHLORIDE: 104 mmol/L (ref 101–111)
CO2: 26 mmol/L (ref 22–32)
CREATININE: 0.67 mg/dL (ref 0.44–1.00)
Calcium: 8.9 mg/dL (ref 8.9–10.3)
Glucose, Bld: 103 mg/dL — ABNORMAL HIGH (ref 65–99)
POTASSIUM: 3.9 mmol/L (ref 3.5–5.1)
SODIUM: 134 mmol/L — AB (ref 135–145)

## 2015-05-19 MED ORDER — CEPHALEXIN 500 MG PO CAPS: 500.0000 mg | ORAL_CAPSULE | Freq: Two times a day (BID) | ORAL | Status: DC

## 2015-05-19 NOTE — Discharge Instructions (Signed)

## 2015-05-21 LAB — URINE CULTURE

## 2015-05-23 ENCOUNTER — Telehealth (HOSPITAL_COMMUNITY): Payer: Self-pay

## 2015-05-23 NOTE — Telephone Encounter (Signed)
Post ED Visit - Positive Culture Follow-up  Culture report reviewed by antimicrobial stewardship pharmacist:  []  Enzo BiNathan Batchelder, Pharm.D. []  Celedonio MiyamotoJeremy Frens, Pharm.D., BCPS [x]  Garvin FilaMike Maccia, Pharm.D. []  Georgina PillionElizabeth Martin, Pharm.D., BCPS []  Aspen HillMinh Pham, [address] Rainbow BoulevardPharm.D., BCPS, AAHIVP []  Estella HuskMichelle Turner, Pharm.D., BCPS, AAHIVP []  Tennis Mustassie Stewart, Pharm.D. []  Sherle Poeob Vincent, [place].  Positive urine culture, >/= 100,000 colonies -> E Coli Treated with Cephalexin, organism sensitive to the same and no further patient follow-up is required at this time.  Arvid RightClark, Milliana Reddoch Dorn 05/23/2015, 5:04 AM

## 2015-10-12 ENCOUNTER — Encounter (HOSPITAL_BASED_OUTPATIENT_CLINIC_OR_DEPARTMENT_OTHER): Payer: Self-pay | Admitting: *Deleted

## 2015-10-12 ENCOUNTER — Emergency Department (HOSPITAL_BASED_OUTPATIENT_CLINIC_OR_DEPARTMENT_OTHER)
Admission: EM | Admit: 2015-10-12 | Discharge: 2015-10-12 | Disposition: A | Discharge status: Home / Self Care | Payer: Self-pay | Attending: Emergency Medicine | Admitting: Emergency Medicine

## 2015-10-12 DIAGNOSIS — H00033 Abscess of eyelid right eye, unspecified eyelid: Secondary | ICD-10-CM

## 2015-10-12 DIAGNOSIS — H05011 Cellulitis of right orbit: Secondary | ICD-10-CM | POA: Insufficient documentation

## 2015-10-12 DIAGNOSIS — F17200 Nicotine dependence, unspecified, uncomplicated: Secondary | ICD-10-CM | POA: Insufficient documentation

## 2015-10-12 MED ORDER — CEPHALEXIN 500 MG PO CAPS: 500.0000 mg | ORAL_CAPSULE | Freq: Four times a day (QID) | ORAL | Status: DC

## 2015-10-12 NOTE — ED Notes (Signed)
MD at bedside. 

## 2015-10-12 NOTE — ED Provider Notes (Signed)
CSN: [id number]     Arrival date & time 10/12/15  [id number] History   First MD Initiated Contact with Patient 10/12/15 [phone]     No chief complaint on file.    (Consider location/radiation/quality/duration/timing/severity/associated sxs/prior Treatment) Patient is a 30 y.o. female presenting with eye pain. The history is provided by the patient.  Eye Pain This is a new problem. The current episode started yesterday. The problem occurs constantly. The problem has been gradually worsening. Associated symptoms comments: Pain, redness and swelling to the right upper eyelid.  Also some matting of the eye this morning and 1 episode of pain shooting in to the right eye.  No pain with movement of the eye but pain with blinking. Exacerbated by: blinking or palpation. Nothing relieves the symptoms. She has tried nothing for the symptoms. The treatment provided no relief.    Past Medical History  Diagnosis Date  . Medical history non-contributory    Past Surgical History  Procedure Laterality Date  . Tubal ligation    . Cesarean section     No family history on file. Social History  Substance Use Topics  . Smoking status: Current Every Day Smoker -- 0.50 packs/day    Types: Cigars  . Smokeless tobacco: None  . Alcohol Use: No   OB History    Gravida Para Term Preterm AB TAB SAB Ectopic Multiple Living   4 3 3       3      Review of Systems  Eyes: Positive for pain.  All other systems reviewed and are negative.     Allergies  Review of patient's allergies indicates no known allergies.  Home Medications   Prior to Admission medications   Medication Sig Start Date End Date Taking? Authorizing Provider  cephALEXin (KEFLEX) 500 MG capsule Take 1 capsule (500 mg total) by mouth 4 (four) times daily. 10/12/15   Gwyneth SproutWhitney Jarrid Lienhard, MD   BP 116/79 mmHg  Pulse 81  Temp(Src) 98.8 F (37.1 C) (Oral)  Resp 18  Ht 5\' 5"  (1.651 m)  Wt 170 lb (77.111 kg)  BMI 28.29 kg/m2  SpO2 100%  LMP  09/20/2015 Physical Exam  Constitutional: She is oriented to person, place, and time. She appears well-developed and well-nourished. No distress.  HENT:  Head: Normocephalic and atraumatic.  Eyes: Conjunctivae are normal. Right eye exhibits no discharge and no exudate.    Cardiovascular: Normal rate.   Pulmonary/Chest: Effort normal.  Neurological: She is alert and oriented to person, place, and time.  Skin: Skin is warm and dry.  Psychiatric: She has a normal mood and affect. Her behavior is normal.  Nursing note and vitals reviewed.   ED Course  Procedures (including critical care time) Labs Review Labs Reviewed - No data to display  Imaging Review No results found. I have personally reviewed and evaluated these images and lab results as part of my medical decision-making.   EKG Interpretation None      MDM   Final diagnoses:  Eyelid cellulitis, right    Patient presenting with swelling, erythema and tenderness to the right upper eyelid. No focal area of induration or fluctuance palpated but most likely developing a stye. No eye involvement or pain with extraocular movements concerning for orbital cellulitis. However because there is no focal lesion palpated will treat for cellulitis with Keflex. Also recommended warm and cool compresses.  Gwyneth SproutWhitney Kalecia Hartney, MD 10/12/15 [phone]

## 2015-10-12 NOTE — ED Notes (Signed)
C/o right eye swollen and pain in eye ball that started yesterday.

## 2015-10-19 ENCOUNTER — Encounter (HOSPITAL_BASED_OUTPATIENT_CLINIC_OR_DEPARTMENT_OTHER): Payer: Self-pay | Admitting: *Deleted

## 2015-10-19 DIAGNOSIS — R1031 Right lower quadrant pain: Secondary | ICD-10-CM | POA: Diagnosis present

## 2015-10-19 DIAGNOSIS — R1084 Generalized abdominal pain: Secondary | ICD-10-CM | POA: Insufficient documentation

## 2015-10-19 DIAGNOSIS — Z79899 Other long term (current) drug therapy: Secondary | ICD-10-CM | POA: Diagnosis not present

## 2015-10-19 DIAGNOSIS — F17200 Nicotine dependence, unspecified, uncomplicated: Secondary | ICD-10-CM | POA: Insufficient documentation

## 2015-10-19 LAB — URINALYSIS, ROUTINE W REFLEX MICROSCOPIC
GLUCOSE, UA: NEGATIVE mg/dL
Ketones, ur: 15 mg/dL — AB
Nitrite: NEGATIVE
PROTEIN: NEGATIVE mg/dL
SPECIFIC GRAVITY, URINE: 1.025 (ref 1.005–1.030)
pH: 6 (ref 5.0–8.0)

## 2015-10-19 LAB — URINE MICROSCOPIC-ADD ON

## 2015-10-19 LAB — COMPREHENSIVE METABOLIC PANEL
ALT: 22 U/L (ref 14–54)
ANION GAP: 9 (ref 5–15)
AST: 18 U/L (ref 15–41)
Albumin: 3.7 g/dL (ref 3.5–5.0)
Alkaline Phosphatase: 54 U/L (ref 38–126)
BILIRUBIN TOTAL: 0.3 mg/dL (ref 0.3–1.2)
BUN: 10 mg/dL (ref 6–20)
CHLORIDE: 107 mmol/L (ref 101–111)
CO2: 23 mmol/L (ref 22–32)
Calcium: 9 mg/dL (ref 8.9–10.3)
Creatinine, Ser: 0.7 mg/dL (ref 0.44–1.00)
GFR calc Af Amer: >60 mL/min (ref >60)
Glucose, Bld: 88 mg/dL (ref 65–99)
POTASSIUM: 4 mmol/L (ref 3.5–5.1)
Sodium: 139 mmol/L (ref 135–145)
TOTAL PROTEIN: 7.4 g/dL (ref 6.5–8.1)

## 2015-10-19 LAB — CBC
HEMATOCRIT: 30.8 % — AB (ref 36.0–46.0)
Hemoglobin: 9.9 g/dL — ABNORMAL LOW (ref 12.0–15.0)
MCH: 23.1 pg — ABNORMAL LOW (ref 26.0–34.0)
MCHC: 32.1 g/dL (ref 30.0–36.0)
MCV: 72 fL — AB (ref 78.0–100.0)
PLATELETS: 271 10*3/uL (ref 150–400)
RBC: 4.28 MIL/uL (ref 3.87–5.11)
RDW: 18.5 % — AB (ref 11.5–15.5)
WBC: 6.4 10*3/uL (ref 4.0–10.5)

## 2015-10-19 LAB — PREGNANCY, URINE: PREG TEST UR: NEGATIVE

## 2015-10-19 NOTE — ED Notes (Signed)
Pt c/o right lower abd pain x 1 day, denies n/v/d

## 2015-10-20 ENCOUNTER — Emergency Department (HOSPITAL_BASED_OUTPATIENT_CLINIC_OR_DEPARTMENT_OTHER)
Admission: EM | Admit: 2015-10-20 | Discharge: 2015-10-20 | Disposition: A | Discharge status: Home / Self Care | Payer: Medicaid Other | Attending: Emergency Medicine | Admitting: Emergency Medicine

## 2015-10-20 ENCOUNTER — Encounter (HOSPITAL_BASED_OUTPATIENT_CLINIC_OR_DEPARTMENT_OTHER): Payer: Self-pay | Admitting: Emergency Medicine

## 2015-10-20 DIAGNOSIS — R109 Unspecified abdominal pain: Secondary | ICD-10-CM

## 2015-10-20 MED ORDER — DICYCLOMINE HCL 20 MG PO TABS: 20.0000 mg | ORAL_TABLET | Freq: Two times a day (BID) | ORAL | Status: DC

## 2015-10-20 MED ORDER — GI COCKTAIL ~~LOC~~
30.0000 mL | Freq: Once | ORAL | Status: AC
  Administered 2015-10-20: 30 mL via ORAL
  Filled 2015-10-20: qty 30

## 2015-10-20 MED ORDER — DICYCLOMINE HCL 10 MG PO CAPS
10.0000 mg | ORAL_CAPSULE | Freq: Once | ORAL | Status: AC
  Administered 2015-10-20: 10 mg via ORAL
  Filled 2015-10-20: qty 1

## 2015-10-20 NOTE — ED Provider Notes (Signed)
CSN: [id number]     Arrival date & time 10/19/15  2314 History   First MD Initiated Contact with Patient 10/20/15 0200     Chief Complaint  Patient presents with  . Abdominal Pain     (Consider location/radiation/quality/duration/timing/severity/associated sxs/prior Treatment) Patient is a 30 y.o. female presenting with abdominal pain. The history is provided by the patient.  Abdominal Pain Pain location:  Suprapubic Pain quality: cramping   Pain radiates to:  Does not radiate Pain severity:  Moderate Onset quality:  Sudden Timing:  Constant Progression:  Resolved Chronicity:  New Context: not alcohol use, not eating and not previous surgeries   Relieved by:  Nothing Worsened by:  Nothing tried Ineffective treatments:  None tried Associated symptoms: no anorexia, no chills, no constipation, no dysuria, no fatigue, no fever, no flatus, no hematemesis, no hematochezia, no hematuria, no nausea, no vaginal discharge and no vomiting   Associated symptoms comment:  Period started in the ED. Has had one day of loose stool Risk factors: not elderly   No f/c/r. No n/v.  No trauma no vaginal discharge.  She thinks it was menstrual cramps and now that her period has started they are gone  Past Medical History  Diagnosis Date  . Medical history non-contributory    Past Surgical History  Procedure Laterality Date  . Tubal ligation    . Cesarean section     History reviewed. No pertinent family history. Social History  Substance Use Topics  . Smoking status: Current Every Day Smoker -- 0.50 packs/day    Types: Cigars  . Smokeless tobacco: None  . Alcohol Use: No   OB History    Gravida Para Term Preterm AB TAB SAB Ectopic Multiple Living   Review of Systems  Constitutional: Negative for fever, chills, diaphoresis, appetite change and fatigue.  Gastrointestinal: Positive for abdominal pain. Negative for nausea, vomiting, constipation, hematochezia, anorexia,  flatus and hematemesis.  Genitourinary: Negative for dysuria, hematuria and vaginal discharge.  All other systems reviewed and are negative.     Allergies  Review of patient's allergies indicates no known allergies.  Home Medications   Prior to Admission medications   Medication Sig Start Date End Date Taking? Authorizing Provider  cephALEXin (KEFLEX) 500 MG capsule Take 500 mg by mouth 4 (four) times daily.   Yes Historical Provider, MD  ibuprofen (ADVIL,MOTRIN) 400 MG tablet Take 400 mg by mouth every 6 (six) hours as needed.   Yes Historical Provider, MD  dicyclomine (BENTYL) 20 MG tablet Take 1 tablet (20 mg total) by mouth 2 (two) times daily. 10/20/15   Ignatz Deis, MD   BP 104/70 mmHg  Pulse 62  Temp(Src) 98.2 F (36.8 C) (Oral)  Resp 16  Ht 5' 5.25" (1.657 m)  Wt 170 lb (77.111 kg)  BMI 28.08 kg/m2  SpO2 100%  LMP 09/18/2015 Physical Exam  Constitutional: She is oriented to person, place, and time. She appears well-developed and well-nourished. No distress.  Sleeping in room upon entrance, very comfortable  HENT:  Head: Normocephalic and atraumatic.  Mouth/Throat: Oropharynx is clear and moist.  Eyes: Conjunctivae are normal. Pupils are equal, round, and reactive to light.  Neck: Normal range of motion. Neck supple.  Cardiovascular: Normal rate, regular rhythm and intact distal pulses.   Pulmonary/Chest: Effort normal and breath sounds normal. No respiratory distress. She has no wheezes. She has no rales.  Abdominal: Soft. Bowel sounds  are normal. There is no tenderness. There is no rigidity, no rebound, no guarding, no tenderness at McBurney's point and negative Murphy's sign.  Musculoskeletal: Normal range of motion.  Neurological: She is alert and oriented to person, place, and time.  Skin: Skin is warm and dry.  Psychiatric: She has a normal mood and affect.    ED Course  Procedures (including critical care time) Labs Review Labs Reviewed  URINALYSIS,  ROUTINE W REFLEX MICROSCOPIC (NOT AT [facility]) - Abnormal; Notable for the following:    APPearance CLOUDY (*)    Hgb urine dipstick LARGE (*)    Bilirubin Urine SMALL (*)    Ketones, ur 15 (*)    Leukocytes, UA TRACE (*)    All other components within normal limits  CBC - Abnormal; Notable for the following:    Hemoglobin 9.9 (*)    HCT 30.8 (*)    MCV 72.0 (*)    MCH 23.1 (*)    RDW 18.5 (*)    All other components within normal limits  URINE MICROSCOPIC-ADD ON - Abnormal; Notable for the following:    Squamous Epithelial / LPF TOO NUMEROUS TO COUNT (*)    Bacteria, UA FEW (*)    All other components within normal limits  PREGNANCY, URINE  COMPREHENSIVE METABOLIC PANEL    Imaging Review No results found. I have personally reviewed and evaluated these images and lab results as part of my medical decision-making.   EKG Interpretation None      MDM   Final diagnoses:  Abdominal cramping   Filed Vitals:   10/19/15 2322 10/20/15 0220  BP: 144/75 104/70  Pulse: 91 62  Temp: 98.2 F (36.8 C)   Resp: 16 16   Results for orders placed or performed during the hospital encounter of 10/20/15  Pregnancy, urine  Result Value Ref Range   Preg Test, Ur NEGATIVE NEGATIVE  Urinalysis, Routine w reflex microscopic (not at [facility])  Result Value Ref Range   Color, Urine YELLOW YELLOW   APPearance CLOUDY (A) CLEAR   Specific Gravity, Urine 1.025 1.005 - 1.030   pH 6.0 5.0 - 8.0   Glucose, UA NEGATIVE NEGATIVE mg/dL   Hgb urine dipstick LARGE (A) NEGATIVE   Bilirubin Urine SMALL (A) NEGATIVE   Ketones, ur 15 (A) NEGATIVE mg/dL   Protein, ur NEGATIVE NEGATIVE mg/dL   Nitrite NEGATIVE NEGATIVE   Leukocytes, UA TRACE (A) NEGATIVE  CBC  Result Value Ref Range   WBC 6.4 4.0 - 10.5 K/uL   RBC 4.28 3.87 - 5.11 MIL/uL   Hemoglobin 9.9 (L) 12.0 - 15.0 g/dL   HCT 40.9 (L) 81.1 - 91.4 %   MCV 72.0 (L) 78.0 - 100.0 fL   MCH 23.1 (L) 26.0 - 34.0 pg   MCHC 32.1 30.0 - 36.0 g/dL   RDW  78.2 (H) 95.6 - 15.5 %   Platelets 271 150 - 400 K/uL  Comprehensive metabolic panel  Result Value Ref Range   Sodium 139 135 - 145 mmol/L   Potassium 4.0 3.5 - 5.1 mmol/L   Chloride 107 101 - 111 mmol/L   CO2 23 22 - 32 mmol/L   Glucose, Bld 88 65 - 99 mg/dL   BUN 10 6 - 20 mg/dL   Creatinine, Ser 2.13 0.44 - 1.00 mg/dL   Calcium 9.0 8.9 - 08.6 mg/dL   Total Protein 7.4 6.5 - 8.1 g/dL   Albumin 3.7 3.5 - 5.0 g/dL   AST 18 15 - 41  U/L   ALT 22 14 - 54 U/L   Alkaline Phosphatase 54 38 - 126 U/L   Total Bilirubin 0.3 0.3 - 1.2 mg/dL   GFR calc non Af Amer >60 >60 mL/min   GFR calc Af Amer >60 >60 mL/min   Anion gap 9 5 - 15  Urine microscopic-add on  Result Value Ref Range   Squamous Epithelial / LPF TOO NUMEROUS TO COUNT (A) NONE SEEN   WBC, UA 0-5 0 - 5 WBC/hpf   RBC / HPF 6-30 0 - 5 RBC/hpf   Bacteria, UA FEW (A) NONE SEEN   Urine-Other MUCOUS PRESENT    No results found.  Medications  dicyclomine (BENTYL) capsule 10 mg (10 mg Oral Given 10/20/15 0222)  gi cocktail (Maalox,Lidocaine,Donnatal) (30 mLs Oral Given 10/20/15 0222)   Exam and vitals are benign and reassuring.    Patient came out to say she would like to go home as the pain is gone.  Strict return precautions given for return pf symptoms or any new or concerning symptoms    Dayvon Dax, MD 10/20/15 [phone]

## 2015-10-20 NOTE — ED Notes (Signed)
Pt verbalizes understanding of d/c instructions and denies any further needs at this time. 

## 2015-10-20 NOTE — ED Notes (Signed)
Pt states earlier tonight she was having severe right pelvic pain, but it has since gone away.  Pt just started period in ED.

## 2015-11-23 ENCOUNTER — Emergency Department (HOSPITAL_COMMUNITY)
Admission: EM | Admit: 2015-11-23 | Discharge: 2015-11-24 | Disposition: A | Discharge status: Home / Self Care | Payer: Medicaid Other | Attending: Emergency Medicine | Admitting: Emergency Medicine

## 2015-11-23 ENCOUNTER — Encounter (HOSPITAL_COMMUNITY): Payer: Self-pay

## 2015-11-23 DIAGNOSIS — H0011 Chalazion right upper eyelid: Secondary | ICD-10-CM | POA: Diagnosis not present

## 2015-11-23 DIAGNOSIS — F17210 Nicotine dependence, cigarettes, uncomplicated: Secondary | ICD-10-CM | POA: Insufficient documentation

## 2015-11-23 DIAGNOSIS — Z79899 Other long term (current) drug therapy: Secondary | ICD-10-CM | POA: Insufficient documentation

## 2015-11-23 DIAGNOSIS — H5711 Ocular pain, right eye: Secondary | ICD-10-CM | POA: Diagnosis present

## 2015-11-23 DIAGNOSIS — R51 Headache: Secondary | ICD-10-CM | POA: Diagnosis not present

## 2015-11-23 DIAGNOSIS — Z7982 Long term (current) use of aspirin: Secondary | ICD-10-CM | POA: Diagnosis not present

## 2015-11-23 DIAGNOSIS — R519 Headache, unspecified: Secondary | ICD-10-CM

## 2015-11-23 NOTE — ED Notes (Signed)
Pt complains of a headache and eye pain, [facility] but her eye isn't getting any better

## 2015-11-24 MED ORDER — METOCLOPRAMIDE HCL 5 MG PO TABS: 5.0000 mg | ORAL_TABLET | Freq: Three times a day (TID) | ORAL | Status: DC | PRN

## 2015-11-24 MED ORDER — METOCLOPRAMIDE HCL 10 MG PO TABS
5.0000 mg | ORAL_TABLET | Freq: Once | ORAL | Status: AC
  Administered 2015-11-24: 5 mg via ORAL
  Filled 2015-11-24: qty 1

## 2015-11-24 MED ORDER — KETOROLAC TROMETHAMINE 30 MG/ML IJ SOLN
30.0000 mg | Freq: Once | INTRAMUSCULAR | Status: AC
  Administered 2015-11-24: 30 mg via INTRAMUSCULAR
  Filled 2015-11-24: qty 1

## 2015-11-24 NOTE — ED Provider Notes (Signed)
CSN: [id number]     Arrival date & time 11/23/15  2200 History   First MD Initiated Contact with Patient 11/24/15 0102     Chief Complaint  Patient presents with  . Eye Pain     (Consider location/radiation/quality/duration/timing/severity/associated sxs/prior Treatment) HPI Comments: Sit 30 year old female who's had swelling to the right upper eyelid for greater than a month.  She was told it is a stye.  She was treated with antibiotic ointment with no relief.  She states it is getting larger.  She also is complaining of a right sided frontal and periorbital headache that she tried over-the-counter Advil with no relief.  She reports slight nausea  Patient is a 30 y.o. female presenting with eye pain. The history is provided by the patient.  Eye Pain This is a recurrent problem. The problem occurs constantly. The problem has been unchanged. Associated symptoms include headaches and nausea. Pertinent negatives include no fever. Nothing aggravates the symptoms. She has tried nothing for the symptoms. The treatment provided no relief.    Past Medical History  Diagnosis Date  . Medical history non-contributory    Past Surgical History  Procedure Laterality Date  . Tubal ligation    . Cesarean section     History reviewed. No pertinent family history. Social History  Substance Use Topics  . Smoking status: Current Every Day Smoker -- 0.50 packs/day    Types: Cigars  . Smokeless tobacco: None  . Alcohol Use: No   OB History    Gravida Para Term Preterm AB TAB SAB Ectopic Multiple Living   4 3 3       3      Review of Systems  Constitutional: Negative for fever.  Eyes: Positive for pain and discharge. Negative for visual disturbance.  Gastrointestinal: Positive for nausea.  Neurological: Positive for headaches.  All other systems reviewed and are negative.     Allergies  Review of patient's allergies indicates no known allergies.  Home Medications   Prior to Admission  medications   Medication Sig Start Date End Date Taking? Authorizing Provider  Aspirin-Acetaminophen-Caffeine (GOODY HEADACHE PO) Take 1 each by mouth once.   Yes Historical Provider, MD  dicyclomine (BENTYL) 20 MG tablet Take 1 tablet (20 mg total) by mouth 2 (two) times daily. Patient not taking: Reported on 11/24/2015 10/20/15   April Palumbo, MD  metoCLOPramide (REGLAN) 5 MG tablet Take 1 tablet (5 mg total) by mouth every 8 (eight) hours as needed for nausea (headache). 11/24/15   Earley FavorGail Shante Archambeault, NP   BP 106/71 mmHg  Pulse 73  Temp(Src) 97.4 F (36.3 C) (Oral)  Resp 18  SpO2 100%  LMP 11/16/2015 Physical Exam  Constitutional: She is oriented to person, place, and time. She appears well-nourished.  HENT:  Head: Normocephalic.  Right Ear: External ear normal.  Mouth/Throat: Oropharynx is clear and moist.  Eyes: EOM are normal. Pupils are equal, round, and reactive to light. Right eye exhibits no discharge, no exudate and no hordeolum. Right conjunctiva is not injected.    Neck: Normal range of motion.  Neurological: She is alert and oriented to person, place, and time.  Skin: Skin is warm.  Nursing note and vitals reviewed.   ED Course  Procedures (including critical care time) Labs Review Labs Reviewed - No data to display  Imaging Review No results found. I have personally reviewed and evaluated these images and lab results as part of my medical decision-making.   EKG Interpretation None    .  She does not appear toxic.  She was given by mouth Reglan by mouth Benadryl IM Toradol for her headache which had totally abated by the time she was discharged.  She will be discharged with instructions to follow-up with Dr. Alden Hipp.  The ophthalmologist to definitively treat her chalazion   MDM   Final diagnoses:  Nonintractable headache, unspecified chronicity pattern, unspecified headache type  Chalazion of right upper eyelid         Earley Favor, NP 11/24/15 0222  Paula Libra, MD 11/24/15 [phone]

## 2015-11-24 NOTE — ED Notes (Signed)
Bed: WA22 Expected date:  Expected time:  Means of arrival:  Comments: 

## 2015-11-24 NOTE — Discharge Instructions (Signed)
Call and make an appointment with Dr. Dione Booze to evaluate and treat your eye mass You have been give an prescription for medication called Reglan.  This will treat any episodes of nausea and headache   General Headache Without Cause A headache is pain or discomfort felt around the head or neck area. The specific cause of a headache may not be found. There are many causes and types of headaches. A few common ones are:  Tension headaches.  Migraine headaches.  Cluster headaches.  Chronic daily headaches. HOME CARE INSTRUCTIONS  Watch your condition for any changes. Take these steps to help with your condition: Managing Pain  Take over-the-counter and prescription medicines only as told by your health care provider.  Lie down in a dark, quiet room when you have a headache.  If directed, apply ice to the head and neck area:  Put ice in a plastic bag.  Place a towel between your skin and the bag.  Leave the ice on for 20 minutes, 2-3 times per day.  Use a heating pad or hot shower to apply heat to the head and neck area as told by your health care provider.  Keep lights dim if bright lights bother you or make your headaches worse. Eating and Drinking  Eat meals on a regular schedule.  Limit alcohol use.  Decrease the amount of caffeine you drink, or stop drinking caffeine. General Instructions  Keep all follow-up visits as told by your health care provider. This is important.  Keep a headache journal to help find out what may trigger your headaches. For example, write down:  What you eat and drink.  How much sleep you get.  Any change to your diet or medicines.  Try massage or other relaxation techniques.  Limit stress.  Sit up straight, and do not tense your muscles.  Do not use tobacco products, including cigarettes, chewing tobacco, or e-cigarettes. If you need help quitting, ask your health care provider.  Exercise regularly as told by your health care  provider.  Sleep on a regular schedule. Get 7-9 hours of sleep, or the amount recommended by your health care provider. SEEK MEDICAL CARE IF:   Your symptoms are not helped by medicine.  You have a headache that is different from the usual headache.  You have nausea or you vomit.  You have a fever. SEEK IMMEDIATE MEDICAL CARE IF:   Your headache becomes severe.  You have repeated vomiting.  You have a stiff neck.  You have a loss of vision.  You have problems with speech.  You have pain in the eye or ear.  You have muscular weakness or loss of muscle control.  You lose your balance or have trouble walking.  You feel faint or pass out.  You have confusion.   This information is not intended to replace advice given to you by your health care provider. Make sure you discuss any questions you have with your health care provider.   Document Released: 06/05/2005 Document Revised: 02/24/2015 Document Reviewed: 09/28/2014 Elsevier Interactive Patient Education 2016 [employer]  Chalazion A chalazion is a swelling or lump on the eyelid. It can affect the upper or lower eyelid. CAUSES This condition may be caused by:  Long-lasting (chronic) inflammation of the eyelid glands.  A blocked oil gland in the eyelid. SYMPTOMS Symptoms of this condition include:  A swelling on the eyelid. The swelling may spread to areas around the eye.  A hard lump on the eyelid.  This lump may make it hard to see out of the eye. DIAGNOSIS This condition is diagnosed with an examination of the eye. TREATMENT This condition is treated by applying a warm compress to the eyelid. If the condition does not improve after two days, it may be treated with:  Surgery.  Medicine that is injected into the chalazion by a health care provider.  Medicine that is applied to the eye. HOME CARE INSTRUCTIONS  Do not touch the chalazion.  Do not try to remove the pus, such as by squeezing the  chalazion or sticking it with a pin or needle.  Do not rub your eyes.  Wash your hands often. Dry your hands with a clean towel.  Keep your face, scalp, and eyebrows clean.  Avoid wearing eye makeup.  Apply a warm, moist compress to the eyelid 4-6 times a day for 10-15 minutes at a time. This will help to open any blocked glands and help to reduce redness and swelling.  Apply over-the-counter and prescription medicines only as told by your health care provider.  If the chalazion does not break open (rupture) on its own in a month, return to your health care provider.  Keep all follow-up appointments as told by your health care provider. This is important. SEEK MEDICAL CARE IF:  Your eyelid has not improved in 4 weeks.  Your eyelid is getting worse.  You have a fever.  The chalazion does not rupture on its own with home treatment in a month. SEEK IMMEDIATE MEDICAL CARE IF:  You have pain in your eye.  Your vision changes.  The chalazion becomes painful or red  The chalazion gets bigger.   This information is not intended to replace advice given to you by your health care provider. Make sure you discuss any questions you have with your health care provider.   Document Released: 06/02/2000 Document Revised: 02/24/2015 Document Reviewed: 09/28/2014 Elsevier Interactive Patient Education [employer]

## 2015-11-24 NOTE — ED Notes (Addendum)
Visual acuity performed.  Pt can read bottom row of eye chart (20/16), with both eyes and while covering each eye however states it is considerably blurry in the affected eye

## 2016-08-24 ENCOUNTER — Encounter (HOSPITAL_BASED_OUTPATIENT_CLINIC_OR_DEPARTMENT_OTHER): Payer: Self-pay | Admitting: Emergency Medicine

## 2016-08-24 ENCOUNTER — Emergency Department (HOSPITAL_BASED_OUTPATIENT_CLINIC_OR_DEPARTMENT_OTHER)
Admission: EM | Admit: 2016-08-24 | Discharge: 2016-08-24 | Disposition: A | Discharge status: Home / Self Care | Payer: Medicaid Other | Attending: Emergency Medicine | Admitting: Emergency Medicine

## 2016-08-24 DIAGNOSIS — F1290 Cannabis use, unspecified, uncomplicated: Secondary | ICD-10-CM | POA: Insufficient documentation

## 2016-08-24 DIAGNOSIS — K0889 Other specified disorders of teeth and supporting structures: Secondary | ICD-10-CM | POA: Diagnosis present

## 2016-08-24 DIAGNOSIS — Z79899 Other long term (current) drug therapy: Secondary | ICD-10-CM | POA: Insufficient documentation

## 2016-08-24 DIAGNOSIS — F17290 Nicotine dependence, other tobacco product, uncomplicated: Secondary | ICD-10-CM | POA: Insufficient documentation

## 2016-08-24 DIAGNOSIS — K032 Erosion of teeth: Secondary | ICD-10-CM | POA: Insufficient documentation

## 2016-08-24 MED ORDER — NAPROXEN 500 MG PO TABS: 500.0000 mg | ORAL_TABLET | Freq: Two times a day (BID) | ORAL | 0 refills | Status: DC

## 2016-08-24 MED ORDER — KETOROLAC TROMETHAMINE 60 MG/2ML IM SOLN
60.0000 mg | Freq: Once | INTRAMUSCULAR | Status: AC
  Administered 2016-08-24: 60 mg via INTRAMUSCULAR
  Filled 2016-08-24: qty 2

## 2016-08-24 MED ORDER — LIDOCAINE VISCOUS 2 % MT SOLN: 15.0000 mL | OROMUCOSAL | 2 refills | Status: DC | PRN

## 2016-08-24 NOTE — ED Provider Notes (Signed)
[facility] Provider Note   CSN: [id number] Arrival date & time: 08/24/16  [id number]     History   Chief Complaint Chief Complaint  Patient presents with  . Dental Pain    HPI Sarah Murray is a 31 y.o. female.  HPI   Sarah Murray is a 31 y.o. female, patient with no pertinent past medical history, presenting to the ED with upper right tooth pain for the last 2 weeks. Pain is aching, moderate to severe, nonradiating. She has tried Orajel and a single dose of naproxen without improvement. She states she is having difficulty finding a dentist that accepts Medicaid. Denies fever/chills, nausea/vomiting, difficulty breathing or swallowing, facial swelling, or any other complaints.   Past Medical History:  Diagnosis Date  . Medical history non-contributory     Patient Active Problem List   Diagnosis Date Noted  . UTI (lower urinary tract infection) 03/22/2013  . RHINITIS, ALLERGIC 08/16/2006    Past Surgical History:  Procedure Laterality Date  . CESAREAN SECTION    . TUBAL LIGATION      OB History    Gravida Para Term Preterm AB Living   4 3 3     3    SAB TAB Ectopic Multiple Live Births                   Home Medications    Prior to Admission medications   Medication Sig Start Date End Date Taking? Authorizing Provider  Aspirin-Acetaminophen-Caffeine (GOODY HEADACHE PO) Take 1 each by mouth once.    Historical Provider, MD  dicyclomine (BENTYL) 20 MG tablet Take 1 tablet (20 mg total) by mouth 2 (two) times daily. Patient not taking: Reported on 11/24/2015 10/20/15   April Palumbo, MD  lidocaine (XYLOCAINE) 2 % solution Use as directed 15 mLs in the mouth or throat as needed for mouth pain. 08/24/16   Shawn C Joy, PA-C  metoCLOPramide (REGLAN) 5 MG tablet Take 1 tablet (5 mg total) by mouth every 8 (eight) hours as needed for nausea (headache). 11/24/15   Earley FavorGail Schulz, NP  naproxen (NAPROSYN) 500 MG tablet Take 1 tablet (500 mg total) by mouth 2 (two) times daily.  08/24/16   Anselm PancoastShawn C Joy, PA-C    Family History No family history on file.  Social History Social History  Substance Use Topics  . Smoking status: Current Every Day Smoker    Packs/day: 0.50    Types: Cigars  . Smokeless tobacco: Never Used  . Alcohol use No     Allergies   Patient has no known allergies.   Review of Systems Review of Systems  Constitutional: Negative for chills and fever.  HENT: Positive for dental problem. Negative for facial swelling, sore throat, trouble swallowing and voice change.   Gastrointestinal: Negative for nausea and vomiting.     Physical Exam Updated Vital Signs BP (!) 92/54 (BP Location: Left Arm)   Pulse 79   Temp 98.9 F (37.2 C) (Oral)   Resp 16   Ht 5' 5.25" (1.657 m)   Wt 72.6 kg   LMP 08/21/2016   SpO2 100%   BMI 26.42 kg/m   Physical Exam  Constitutional: She appears well-developed and well-nourished. No distress.  HENT:  Head: Normocephalic and atraumatic.  Erosion noted to the upper right rearmost molar. No surrounding fluctuance, erythema, swelling, or other abnormality. Handles oral secretions without difficulty. No trismus. No swelling into the soft tissues of the neck or submental space. No facial swelling noted.  Eyes: Conjunctivae are normal.  Neck: Normal range of motion. Neck supple.  Cardiovascular: Normal rate and regular rhythm.   Pulmonary/Chest: Effort normal.  Lymphadenopathy:    She has no cervical adenopathy.  Neurological: She is alert.  Skin: Skin is warm and dry. She is not diaphoretic.  Psychiatric: She has a normal mood and affect. Her behavior is normal.  Nursing note and vitals reviewed.    ED Treatments / Results  Labs (all labs ordered are listed, but only abnormal results are displayed) Labs Reviewed - No data to display  EKG  EKG Interpretation None       Radiology No results found.  Procedures Procedures (including critical care time)  Medications Ordered in  ED Medications  ketorolac (TORADOL) injection 60 mg (60 mg Intramuscular Given 08/24/16 0919)     Initial Impression / Assessment and Plan / ED Course  I have reviewed the triage vital signs and the nursing notes.  Pertinent labs & imaging results that were available during my care of the patient were reviewed by me and considered in my medical decision making (see chart for details).     Patient presents with dental pain for the last 2 weeks. No signs of Ludwig's angioedema or sepsis. Patient was offered a dental block, but declined. Dental follow-up recommended. A large number of resources were given and discussed. Pain management and return precautions discussed. Patient voices understanding of all instructions and is comfortable with discharge.    Final Clinical Impressions(s) / ED Diagnoses   Final diagnoses:  Pain, dental    New Prescriptions Discharge Medication List as of 08/24/2016  9:28 AM    START taking these medications   Details  lidocaine (XYLOCAINE) 2 % solution Use as directed 15 mLs in the mouth or throat as needed for mouth pain., Starting Thu 08/24/2016, Print    naproxen (NAPROSYN) 500 MG tablet Take 1 tablet (500 mg total) by mouth 2 (two) times daily., Starting Thu 08/24/2016, Print         Anselm Pancoast, PA-C 08/24/16 1105    Lavera Guise, MD 08/25/16 1343

## 2016-08-24 NOTE — Discharge Instructions (Signed)
You have been seen today for dental pain. You should follow up with a dentist as soon as possible. This problem will not resolve on its own without the care of a dentist. Use ibuprofen or naproxen for pain. Use the viscous lidocaine for mouth pain. Swish with the lidocaine and spit it out. Do not swallow it. ° °Dental Resource Guide ° °[facility] °[address], [address] °[place], [place] [address] °[phone] ° °[facility] °[address] °[place], [place] [address] °[phone] ° °[facility] °[address] °[place], [place] [address] °[phone]. [phone] ° °[facility] °[address] °[place], [place] [address] °[phone] ° °[facility] °[address] °[place], [place] [address] °[phone] ° °[employer] °Www.[facility]istry.[employer].edu/patientcare/studentclinics/becomepatient ° °[employer] °[address] °[place], [place] [address] °[phone] ° °Website for free, low-income, or sliding scale dental services in [place]: °www.freedental.us ° °To find a dentist in [place] and surrounding areas: °www.ncdental.org/for-the-public/find-a-dentist ° °[facility] °http://www.ncdental.org/meetings-events/-[facility] ° ° Medicaid Dentist °https://dma.ncdhhs.gov/find-a-doctor/medicaid-dental-providers ° °

## 2016-08-24 NOTE — ED Triage Notes (Signed)
R upper dental pain from decayed tooth x 2 weeks. Pt reports having trouble finding a dentist that accepts medicaid.

## 2016-10-04 ENCOUNTER — Emergency Department (HOSPITAL_BASED_OUTPATIENT_CLINIC_OR_DEPARTMENT_OTHER)
Admission: EM | Admit: 2016-10-04 | Discharge: 2016-10-04 | Disposition: A | Discharge status: Home / Self Care | Payer: Medicaid Other | Attending: Emergency Medicine | Admitting: Emergency Medicine

## 2016-10-04 ENCOUNTER — Encounter (HOSPITAL_BASED_OUTPATIENT_CLINIC_OR_DEPARTMENT_OTHER): Payer: Self-pay | Admitting: Emergency Medicine

## 2016-10-04 DIAGNOSIS — Z7982 Long term (current) use of aspirin: Secondary | ICD-10-CM | POA: Insufficient documentation

## 2016-10-04 DIAGNOSIS — K0889 Other specified disorders of teeth and supporting structures: Secondary | ICD-10-CM | POA: Diagnosis not present

## 2016-10-04 DIAGNOSIS — F17290 Nicotine dependence, other tobacco product, uncomplicated: Secondary | ICD-10-CM | POA: Diagnosis not present

## 2016-10-04 DIAGNOSIS — Z79899 Other long term (current) drug therapy: Secondary | ICD-10-CM | POA: Insufficient documentation

## 2016-10-04 MED ORDER — IBUPROFEN 800 MG PO TABS
800.0000 mg | ORAL_TABLET | Freq: Once | ORAL | Status: AC
  Administered 2016-10-04: 800 mg via ORAL
  Filled 2016-10-04: qty 1

## 2016-10-04 MED ORDER — OXYCODONE HCL 5 MG PO TABS
5.0000 mg | ORAL_TABLET | Freq: Once | ORAL | Status: AC
  Administered 2016-10-04: 5 mg via ORAL
  Filled 2016-10-04: qty 1

## 2016-10-04 MED ORDER — ACETAMINOPHEN 500 MG PO TABS
1000.0000 mg | ORAL_TABLET | Freq: Once | ORAL | Status: AC
  Administered 2016-10-04: 1000 mg via ORAL
  Filled 2016-10-04: qty 2

## 2016-10-04 MED ORDER — CLINDAMYCIN HCL 150 MG PO CAPS: 450.0000 mg | ORAL_CAPSULE | Freq: Three times a day (TID) | ORAL | 0 refills | Status: AC

## 2016-10-04 NOTE — ED Provider Notes (Signed)
[facility]   CSN: [id number] Arrival date & time: 10/04/16  1313     History   Chief Complaint Chief Complaint  Patient presents with  . Dental Pain    HPI Sarah Murray is a 31 y.o. female.  31 yo F with a chief complaints of right upper dental pain. This is to the third molar and wisdom tooth on the right side. Denies fevers or chills. Denies injury. Thinks it's mildly more swollen than it has been. Has done rounds of antibiotics without improvement. Been taking Aleve but is making her sleepy so she is unable to take it at work. Looking for something else for pain.   The history is provided by the patient.  Dental Pain   This is a chronic problem. The current episode started more than 1 week ago. The problem occurs constantly. The problem has not changed since onset.The pain is at a severity of 8/10. The pain is moderate. She has tried nothing for the symptoms. The treatment provided no relief.    Past Medical History:  Diagnosis Date  . Medical history non-contributory     Patient Active Problem List   Diagnosis Date Noted  . UTI (lower urinary tract infection) 03/22/2013  . RHINITIS, ALLERGIC 08/16/2006    Past Surgical History:  Procedure Laterality Date  . CESAREAN SECTION    . TUBAL LIGATION      OB History    Gravida Para Term Preterm AB Living   SAB TAB Ectopic Multiple Live Births                   Home Medications    Prior to Admission medications   Medication Sig Start Date End Date Taking? Authorizing Provider  Aspirin-Acetaminophen-Caffeine (GOODY HEADACHE PO) Take 1 each by mouth once.    Historical Provider, MD  dicyclomine (BENTYL) 20 MG tablet Take 1 tablet (20 mg total) by mouth 2 (two) times daily. Patient not taking: Reported on 11/24/2015 10/20/15   April Palumbo, MD  lidocaine (XYLOCAINE) 2 % solution Use as directed 15 mLs in the mouth or throat as needed for mouth pain. 08/24/16   Shawn C Joy, PA-C    metoCLOPramide (REGLAN) 5 MG tablet Take 1 tablet (5 mg total) by mouth every 8 (eight) hours as needed for nausea (headache). 11/24/15   Earley Favor, NP  naproxen (NAPROSYN) 500 MG tablet Take 1 tablet (500 mg total) by mouth 2 (two) times daily. 08/24/16   Anselm Pancoast, PA-C    Family History History reviewed. No pertinent family history.  Social History Social History  Substance Use Topics  . Smoking status: Current Every Day Smoker    Packs/day: 0.50    Types: Cigars  . Smokeless tobacco: Never Used  . Alcohol use No     Allergies   Patient has no known allergies.   Review of Systems Review of Systems  Constitutional: Negative for chills and fever.  HENT: Positive for dental problem. Negative for congestion and rhinorrhea.   Eyes: Negative for redness and visual disturbance.  Respiratory: Negative for shortness of breath and wheezing.   Cardiovascular: Negative for chest pain and palpitations.  Gastrointestinal: Negative for nausea and vomiting.  Genitourinary: Negative for dysuria and urgency.  Musculoskeletal: Negative for arthralgias and myalgias.  Skin: Negative for pallor, rash and wound.  Neurological: Negative for dizziness and headaches.     Physical Exam Updated Vital  Signs BP (!) 100/59 (BP Location: Right Arm)   Pulse 89   Temp 98.4 F (36.9 C) (Oral)   Resp 18   Ht  (1.651 m)   Wt 150 lb (68 kg)   LMP 09/16/2016   SpO2 100%   BMI 24.96 kg/m   Physical Exam  Constitutional: She is oriented to person, place, and time. She appears well-developed and well-nourished. No distress.  HENT:  Head: Normocephalic and atraumatic.  Mouth/Throat:    Eyes: EOM are normal. Pupils are equal, round, and reactive to light.  Neck: Normal range of motion. Neck supple.  Cardiovascular: Normal rate and regular rhythm.  Exam reveals no gallop and no friction rub.   No murmur heard. Pulmonary/Chest: Effort normal. She has no wheezes. She has no rales.   Abdominal: Soft. She exhibits no distension and no mass. There is no tenderness. There is no guarding.  Musculoskeletal: She exhibits no edema or tenderness.  Neurological: She is alert and oriented to person, place, and time.  Skin: Skin is warm and dry. She is not diaphoretic.  Psychiatric: She has a normal mood and affect. Her behavior is normal.  Nursing note and vitals reviewed.    ED Treatments / Results  Labs (all labs ordered are listed, but only abnormal results are displayed) Labs Reviewed - No data to display  EKG  EKG Interpretation None       Radiology No results found.  Procedures Procedures (including critical care time)  Medications Ordered in ED Medications  acetaminophen (TYLENOL) tablet 1,000 mg (not administered)  ibuprofen (ADVIL,MOTRIN) tablet 800 mg (not administered)  oxyCODONE (Oxy IR/ROXICODONE) immediate release tablet 5 mg (not administered)     Initial Impression / Assessment and Plan / ED Course  I have reviewed the triage vital signs and the nursing notes.  Pertinent labs & imaging results that were available during my care of the patient were reviewed by me and considered in my medical decision making (see chart for details).     31 yo F With a chief complaint of right upper dental pain. Going on for the past couple months. Is waiting to see a dentist in the next week or so. I discussed with the patient our current policy on narcotics for outpatient dental pain. Offered a dental block which she refused. Will start on clindamycin. Dental follow-up.  2:22 PM:  I have discussed the diagnosis/risks/treatment options with the patient and family and believe the pt to be eligible for discharge home to follow-up with PCP. We also discussed returning to the ED immediately if new or worsening sx occur. We discussed the sx which are most concerning (e.g., sudden worsening pain, fever, inability to tolerate by mouth) that necessitate immediate return.  Medications administered to the patient during their visit and any new prescriptions provided to the patient are listed below.  Medications given during this visit Medications  acetaminophen (TYLENOL) tablet 1,000 mg (not administered)  ibuprofen (ADVIL,MOTRIN) tablet 800 mg (not administered)  oxyCODONE (Oxy IR/ROXICODONE) immediate release tablet 5 mg (not administered)     The patient appears reasonably screen and/or stabilized for discharge and I doubt any other medical condition or other [facility] requiring further screening, evaluation, or treatment in the ED at this time prior to discharge.    Final Clinical Impressions(s) / ED Diagnoses   Final diagnoses:  Pain, dental    New Prescriptions New Prescriptions   No medications on file     Melene Plan, DO 10/04/16 1423

## 2016-10-04 NOTE — Discharge Instructions (Signed)
Take 4 over the counter ibuprofen tablets 3 times a day or 2 over-the-counter naproxen tablets twice a day for pain. Also take tylenol 1000mg(2 extra strength) four times a day.    

## 2016-10-04 NOTE — ED Notes (Signed)
Patient seen and assessed at D/c - Patient in no distress. Upset because of her wait

## 2019-11-15 ENCOUNTER — Emergency Department (HOSPITAL_BASED_OUTPATIENT_CLINIC_OR_DEPARTMENT_OTHER)
Admission: EM | Admit: 2019-11-15 | Discharge: 2019-11-15 | Disposition: A | Discharge status: Home / Self Care | Payer: Medicaid Other | Attending: Emergency Medicine | Admitting: Emergency Medicine

## 2019-11-15 ENCOUNTER — Other Ambulatory Visit: Payer: Self-pay

## 2019-11-15 ENCOUNTER — Encounter (HOSPITAL_BASED_OUTPATIENT_CLINIC_OR_DEPARTMENT_OTHER): Payer: Self-pay

## 2019-11-15 DIAGNOSIS — L02214 Cutaneous abscess of groin: Secondary | ICD-10-CM | POA: Insufficient documentation

## 2019-11-15 DIAGNOSIS — R2242 Localized swelling, mass and lump, left lower limb: Secondary | ICD-10-CM | POA: Diagnosis present

## 2019-11-15 DIAGNOSIS — Z79899 Other long term (current) drug therapy: Secondary | ICD-10-CM | POA: Diagnosis not present

## 2019-11-15 MED ORDER — LIDOCAINE-EPINEPHRINE (PF) 2 %-1:200000 IJ SOLN
10.0000 mL | Freq: Once | INTRAMUSCULAR | Status: AC
  Administered 2019-11-15: 10 mL
  Filled 2019-11-15: qty 10

## 2019-11-15 NOTE — ED Notes (Signed)
ED Provider at bedside for I&D.   

## 2019-11-15 NOTE — Discharge Instructions (Signed)
Use warm soaks and compresses to help promote healing.  Packing will likely fall out on its own but if packing is still in place in 2 days gently pull to remove packing after doing a warm soak or shower.  As long as abscess is improving and healing appropriately you can follow-up with PCP as needed, but if you start to feel that the abscess is reforming, you are experiencing increasing pain, drainage, redness of the surrounding skin, fevers or any other new or concerning symptoms return to the emergency department.

## 2019-11-15 NOTE — ED Triage Notes (Signed)
Pt arrives with c/o boil to left groin X1 day.

## 2019-11-15 NOTE — ED Notes (Signed)
ED Provider at bedside. 

## 2019-11-15 NOTE — ED Provider Notes (Signed)
[facility] EMERGENCY DEPARTMENT Provider Note   CSN: [id number] Arrival date & time: 11/15/19  1700     History Chief Complaint  Patient presents with  . Abscess    Sarah Murray is a 34 y.o. female.  Sarah Murray is a 34 y.o. female who is otherwise healthy, presents to the ED for evaluation of abscess.  She reports she noticed a painful swollen area to the left groin yesterday.  It has become increasingly painful today especially when she tries to walk, she states that she is post work 12-hour shifts where she is on her feet constantly.  She has not noted any drainage from the area.  She has never had similar abscesses or skin infections before, denies any associated fevers, no dysuria or urinary frequency.  No other aggravating or alleviating factors.        Past Medical History:  Diagnosis Date  . Medical history non-contributory     Patient Active Problem List   Diagnosis Date Noted  . UTI (lower urinary tract infection) 03/22/2013  . RHINITIS, ALLERGIC 08/16/2006    Past Surgical History:  Procedure Laterality Date  . CESAREAN SECTION    . TUBAL LIGATION       OB History    Gravida  4   Para  3   Term  3   Preterm      AB      Living  3     SAB      TAB      Ectopic      Multiple      Live Births              No family history on file.  Social History   Tobacco Use  . Smoking status: Former Smoker    Packs/day: 0.50    Types: Cigars  . Smokeless tobacco: Never Used  Substance Use Topics  . Alcohol use: No  . Drug use: Yes    Types: Marijuana    Home Medications Prior to Admission medications   Medication Sig Start Date End Date Taking? Authorizing Provider  Aspirin-Acetaminophen-Caffeine (GOODY HEADACHE PO) Take 1 each by mouth once.    [provider]  dicyclomine (BENTYL) 20 MG tablet Take 1 tablet (20 mg total) by mouth 2 (two) times daily. Patient not taking: Reported on 11/24/2015 10/20/15   Palumbo,  April, MD  lidocaine (XYLOCAINE) 2 % solution Use as directed 15 mLs in the mouth or throat as needed for mouth pain. 08/24/16   Joy, Shawn C, PA-C  metoCLOPramide (REGLAN) 5 MG tablet Take 1 tablet (5 mg total) by mouth every 8 (eight) hours as needed for nausea (headache). 11/24/15   Earley Favor, NP  naproxen (NAPROSYN) 500 MG tablet Take 1 tablet (500 mg total) by mouth 2 (two) times daily. 08/24/16   Joy, Hillard Danker, PA-C    Allergies    Patient has no known allergies.  Review of Systems   Review of Systems  Constitutional: Negative for chills and fever.  Genitourinary: Negative for dysuria, frequency, pelvic pain, vaginal bleeding and vaginal discharge.  Skin: Negative for color change and rash.       Abscess    Physical Exam Updated Vital Signs BP 116/77 (BP Location: Right Arm)   Pulse 87   Temp 98.5 F (36.9 C) (Oral)   Resp 18   Ht 5\' 5"  (1.651 m)   Wt 70.3 kg   LMP 11/15/2019   SpO2 100%  BMI 25.79 kg/m   Physical Exam Vitals and nursing note reviewed.  Constitutional:      General: She is not in acute distress.    Appearance: Normal appearance. She is well-developed and normal weight. She is not ill-appearing or diaphoretic.  HENT:     Head: Normocephalic and atraumatic.  Eyes:     General:        Right eye: No discharge.        Left eye: No discharge.  Pulmonary:     Effort: Pulmonary effort is normal. No respiratory distress.  Genitourinary:    Comments: Chaperone present during exam. There is a tender fluctuant area in the skin fold of the left groin adjacent to the labia but there is no fluctuance or induration of the labia.  No expressible drainage. Musculoskeletal:        General: No deformity.     Cervical back: Neck supple.  Skin:    General: Skin is warm and dry.  Neurological:     Mental Status: She is alert and oriented to person, place, and time.     Coordination: Coordination normal.  Psychiatric:        Mood and Affect: Mood normal.         Behavior: Behavior normal.     ED Results / Procedures / Treatments   Labs (all labs ordered are listed, but only abnormal results are displayed) Labs Reviewed - No data to display  EKG None  Radiology No results found.  Procedures .Marland KitchenIncision and Drainage  Date/Time: 11/15/2019 6:12 PM Performed by: Dartha Lodge, PA-C Authorized by: Dartha Lodge, PA-C   Consent:    Consent obtained:  Verbal   Consent given by:  Patient and parent   Risks discussed:  Bleeding, incomplete drainage, pain, infection and damage to other organs   Alternatives discussed:  No treatment Location:    Type:  Abscess   Location:  Anogenital   Anogenital location: left groin, no labial involvement. Pre-procedure details:    Skin preparation:  Chloraprep Anesthesia (see MAR for exact dosages):    Anesthesia method:  Local infiltration   Local anesthetic:  Lidocaine 2% WITH epi Procedure type:    Complexity:  Simple Procedure details:    Incision types:  Single straight   Incision depth:  Dermal   Scalpel blade:  11   Wound management:  Irrigated with saline and probed and deloculated   Drainage:  Bloody and purulent   Drainage amount:  Copious   Wound treatment:  Wound left open   Packing materials:  1/2 in iodoform gauze Post-procedure details:    Patient tolerance of procedure:  Tolerated well, no immediate complications  Ultrasound ED Soft Tissue  Date/Time: 11/15/2019 6:20 PM Performed by: Dartha Lodge, PA-C Authorized by: Dartha Lodge, PA-C   Procedure details:    Indications: localization of abscess     Transverse view:  Visualized   Longitudinal view:  Visualized   Images: archived   Location:    Location: groin     Side:  Left Findings:     abscess present   (including critical care time)  Medications Ordered in ED Medications  lidocaine-EPINEPHrine (XYLOCAINE W/EPI) 2 %-1:200000 (PF) injection 10 mL (10 mLs Infiltration Given 11/15/19 1736)    ED Course  I  have reviewed the triage vital signs and the nursing notes.  Pertinent labs & imaging results that were available during my care of the patient were reviewed by me and  considered in my medical decision making (see chart for details).    MDM Rules/Calculators/A&P                      Patient with skin abscess  In the left groin amenable to incision and drainage.  Fluid collection confirmed with bedside ultrasound.  Due to abscess location and skin fold a small amount of packing was placed to help keep the area open.  Encourage patient to return for wound check in 2 days if she does not feel the area is improving.. Encouraged home warm soaks and flushing.  No significant surrounding cellulitis so do not feel that antibiotic therapy is indicated.  Discharged home in good condition.  Final Clinical Impression(s) / ED Diagnoses Final diagnoses:  Abscess of left groin    Rx / DC Orders ED Discharge Orders    None       Jacqlyn Larsen, [place] 11/15/19 1823    Malvin Johns, MD 11/15/19 [phone]

## 2020-01-01 ENCOUNTER — Other Ambulatory Visit: Payer: Self-pay

## 2020-01-01 ENCOUNTER — Emergency Department (HOSPITAL_BASED_OUTPATIENT_CLINIC_OR_DEPARTMENT_OTHER)
Admission: EM | Admit: 2020-01-01 | Discharge: 2020-01-01 | Disposition: A | Discharge status: Home / Self Care | Payer: No Typology Code available for payment source | Attending: Emergency Medicine | Admitting: Emergency Medicine

## 2020-01-01 ENCOUNTER — Encounter (HOSPITAL_BASED_OUTPATIENT_CLINIC_OR_DEPARTMENT_OTHER): Payer: Self-pay | Admitting: Emergency Medicine

## 2020-01-01 DIAGNOSIS — T148XXA Other injury of unspecified body region, initial encounter: Secondary | ICD-10-CM | POA: Diagnosis not present

## 2020-01-01 DIAGNOSIS — Y939 Activity, unspecified: Secondary | ICD-10-CM | POA: Diagnosis not present

## 2020-01-01 DIAGNOSIS — Z87891 Personal history of nicotine dependence: Secondary | ICD-10-CM | POA: Diagnosis not present

## 2020-01-01 DIAGNOSIS — S29002A Unspecified injury of muscle and tendon of back wall of thorax, initial encounter: Secondary | ICD-10-CM | POA: Diagnosis present

## 2020-01-01 DIAGNOSIS — Y92410 Unspecified street and highway as the place of occurrence of the external cause: Secondary | ICD-10-CM | POA: Diagnosis not present

## 2020-01-01 DIAGNOSIS — Y999 Unspecified external cause status: Secondary | ICD-10-CM | POA: Diagnosis not present

## 2020-01-01 DIAGNOSIS — M542 Cervicalgia: Secondary | ICD-10-CM | POA: Insufficient documentation

## 2020-01-01 MED ORDER — NAPROXEN 500 MG PO TABS: ORAL_TABLET | ORAL | 0 refills | Status: DC

## 2020-01-01 MED ORDER — CYCLOBENZAPRINE HCL 10 MG PO TABS
10.0000 mg | ORAL_TABLET | Freq: Once | ORAL | Status: AC
  Administered 2020-01-01: 10 mg via ORAL
  Filled 2020-01-01: qty 1

## 2020-01-01 MED ORDER — NAPROXEN 250 MG PO TABS
500.0000 mg | ORAL_TABLET | Freq: Once | ORAL | Status: AC
  Administered 2020-01-01: 500 mg via ORAL
  Filled 2020-01-01: qty 2

## 2020-01-01 MED ORDER — CYCLOBENZAPRINE HCL 10 MG PO TABS: 10.0000 mg | ORAL_TABLET | Freq: Three times a day (TID) | ORAL | 0 refills | Status: DC | PRN
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
## 2021-01-19 ENCOUNTER — Encounter (HOSPITAL_BASED_OUTPATIENT_CLINIC_OR_DEPARTMENT_OTHER): Payer: Self-pay | Admitting: *Deleted

## 2021-01-19 ENCOUNTER — Other Ambulatory Visit: Payer: Self-pay

## 2021-01-19 DIAGNOSIS — R079 Chest pain, unspecified: Secondary | ICD-10-CM | POA: Diagnosis present

## 2021-01-19 DIAGNOSIS — Z87891 Personal history of nicotine dependence: Secondary | ICD-10-CM | POA: Insufficient documentation

## 2021-01-19 DIAGNOSIS — R0789 Other chest pain: Secondary | ICD-10-CM | POA: Diagnosis not present

## 2021-01-19 NOTE — ED Triage Notes (Addendum)
C/o  left sided chest pain x 1 year , increased pain with deep breathing ,denies SOb or nausea

## 2021-01-20 ENCOUNTER — Emergency Department (HOSPITAL_BASED_OUTPATIENT_CLINIC_OR_DEPARTMENT_OTHER): Payer: Medicaid Other

## 2021-01-20 ENCOUNTER — Emergency Department (HOSPITAL_BASED_OUTPATIENT_CLINIC_OR_DEPARTMENT_OTHER)
Admission: EM | Admit: 2021-01-20 | Discharge: 2021-01-20 | Disposition: A | Discharge status: Home / Self Care | Payer: Medicaid Other | Attending: Emergency Medicine | Admitting: Emergency Medicine

## 2021-01-20 DIAGNOSIS — R0789 Other chest pain: Secondary | ICD-10-CM

## 2021-01-20 NOTE — ED Notes (Signed)
Pt placed on the 5 lead, BP cuff and pulse ox

## 2021-01-20 NOTE — ED Provider Notes (Signed)
[facility] EMERGENCY DEPARTMENT Provider Note   CSN: [id number] Arrival date & time: 01/19/21  2339     History Chief Complaint  Patient presents with   Chest Pain    Sarah Murray is a 35 y.o. female.  The history is provided by the patient.  Chest Pain Sarah Murray is a 35 y.o. female who presents to the Emergency Department complaining of chest pain.  She presents to the ED complaining of chest pain.  Pain started about one year ago. Today pain was worse.  Pain is located in the left upper chest and is sharp in nature.  Pain lasts a few seconds at a time and radiates to her left scapula.  Pain was worse today, which prompted ED evaluation.  Pain is worse with deep breaths. Pain is worse with abduction of the shoulders.    No fever, cough sob, AP, N/V, leg swelling.  She is under increased stressed.  She works for [employer].    Does not take medication.  No hx/o DVT/PE.      Past Medical History:  Diagnosis Date   Medical history non-contributory     Patient Active Problem List   Diagnosis Date Noted   UTI (lower urinary tract infection) 03/22/2013   RHINITIS, ALLERGIC 08/16/2006    Past Surgical History:  Procedure Laterality Date   CESAREAN SECTION     TUBAL LIGATION       OB History     Gravida  4   Para  3   Term  3   Preterm      AB      Living  3      SAB      IAB      Ectopic      Multiple      Live Births              No family history on file.  Social History   Tobacco Use   Smoking status: Former    Packs/day: 0.50    Types: Cigars, Cigarettes   Smokeless tobacco: Never  Substance Use Topics   Alcohol use: No   Drug use: Yes    Types: Marijuana    Home Medications Prior to Admission medications   Medication Sig Start Date End Date Taking? Authorizing Provider  cyclobenzaprine (FLEXERIL) 10 MG tablet Take 1 tablet (10 mg total) by mouth 3 (three) times daily as needed for muscle spasms. 01/01/20   Molpus, John,  MD  naproxen (NAPROSYN) 500 MG tablet Take 1 twice daily as needed for pain. 01/01/20   Molpus, John, MD  dicyclomine (BENTYL) 20 MG tablet Take 1 tablet (20 mg total) by mouth 2 (two) times daily. Patient not taking: Reported on 11/24/2015 10/20/15 01/01/20  Palumbo, April, MD  metoCLOPramide (REGLAN) 5 MG tablet Take 1 tablet (5 mg total) by mouth every 8 (eight) hours as needed for nausea (headache). 11/24/15 01/01/20  Earley Favor, NP    Allergies    Patient has no known allergies.  Review of Systems   Review of Systems  Cardiovascular:  Positive for chest pain.  All other systems reviewed and are negative.  Physical Exam Updated Vital Signs BP 106/76   Pulse 67   Temp 98.4 F (36.9 C) (Oral)   Resp 13   Ht 5\' 5"  (1.651 m)   Wt 68 kg   LMP 01/13/2021   SpO2 100%   BMI 24.96 kg/m   Physical Exam Vitals and nursing  note reviewed.  Constitutional:      Appearance: She is well-developed.  HENT:     Head: Normocephalic and atraumatic.  Cardiovascular:     Rate and Rhythm: Normal rate and regular rhythm.     Heart sounds: No murmur heard. Pulmonary:     Effort: Pulmonary effort is normal. No respiratory distress.     Breath sounds: Normal breath sounds.  Chest:     Chest wall: No tenderness.  Abdominal:     Palpations: Abdomen is soft.     Tenderness: There is no abdominal tenderness. There is no guarding or rebound.  Musculoskeletal:        General: No swelling or tenderness.     Comments: 2+ DP pulses bilaterally  Skin:    General: Skin is warm and dry.  Neurological:     Mental Status: She is alert and oriented to person, place, and time.  Psychiatric:        Behavior: Behavior normal.    ED Results / Procedures / Treatments   Labs (all labs ordered are listed, but only abnormal results are displayed) Labs Reviewed - No data to display  EKG None ED ECG REPORT   Date: 01/20/2021  Rate: 70  Rhythm: normal sinus rhythm  QRS Axis: normal  Intervals: normal   ST/T Wave abnormalities: normal  Conduction Disutrbances:none  Narrative Interpretation:   Old EKG Reviewed: none available  I have personally reviewed the EKG tracing and agree with the computerized printout as noted.  Radiology DG Chest 2 View  Result Date: 01/20/2021 CLINICAL DATA:  Chest pain EXAM: CHEST - 2 VIEW COMPARISON:  Chest radiograph dated 05/18/2015. FINDINGS: The heart size and mediastinal contours are within normal limits. Both lungs are clear. The visualized skeletal structures are unremarkable. IMPRESSION: No active cardiopulmonary disease. Electronically Signed   By: Elgie Collard M.D.   On: 01/20/2021 00:21    Procedures Procedures   Medications Ordered in ED Medications - No data to display  ED Course  I have reviewed the triage vital signs and the nursing notes.  Pertinent labs & imaging results that were available during my care of the patient were reviewed by me and considered in my medical decision making (see chart for details).    MDM Rules/Calculators/A&P                          patient here for evaluation of one year of intermittent chest pain, can be reproduced on certain movements. EKG is without acute ischemic changes in chest x-ray is within normal limits. Presentation is not consistent with cardiac chest pain, PE, dissection, pneumonia. Discussed with patient home care for atypical chest pain, likely musculoskeletal in origin. Discussed NSAIDs, rest, outpatient follow-up and return precautions.  Final Clinical Impression(s) / ED Diagnoses Final diagnoses:  Atypical chest pain    Rx / DC Orders ED Discharge Orders     None        Tilden Fossa, MD 01/20/21 0201

## 2021-05-01 ENCOUNTER — Encounter (HOSPITAL_BASED_OUTPATIENT_CLINIC_OR_DEPARTMENT_OTHER): Payer: Self-pay | Admitting: Emergency Medicine

## 2021-05-01 ENCOUNTER — Other Ambulatory Visit: Payer: Self-pay

## 2021-05-01 ENCOUNTER — Emergency Department (HOSPITAL_BASED_OUTPATIENT_CLINIC_OR_DEPARTMENT_OTHER)
Admission: EM | Admit: 2021-05-01 | Discharge: 2021-05-01 | Disposition: A | Discharge status: Home / Self Care | Payer: Medicaid Other | Attending: Emergency Medicine | Admitting: Emergency Medicine

## 2021-05-01 DIAGNOSIS — B3731 Acute candidiasis of vulva and vagina: Secondary | ICD-10-CM | POA: Diagnosis present

## 2021-05-01 DIAGNOSIS — Z5321 Procedure and treatment not carried out due to patient leaving prior to being seen by health care provider: Secondary | ICD-10-CM | POA: Diagnosis not present

## 2021-05-01 LAB — URINALYSIS, ROUTINE W REFLEX MICROSCOPIC
Bilirubin Urine: NEGATIVE
Glucose, UA: NEGATIVE mg/dL
Hgb urine dipstick: NEGATIVE
Ketones, ur: NEGATIVE mg/dL
Leukocytes,Ua: NEGATIVE
Nitrite: NEGATIVE
Protein, ur: NEGATIVE mg/dL
Specific Gravity, Urine: 1.02 (ref 1.005–1.030)
pH: 7 (ref 5.0–8.0)

## 2021-05-01 LAB — PREGNANCY, URINE: Preg Test, Ur: NEGATIVE

## 2021-05-01 NOTE — ED Triage Notes (Addendum)
Pt reports she started on amoxicillin a week ago for a UTI complicated by kidney stones. Pt reports the abx started to cause a vaginal yeast infection. Tried to treat with monostat 1 which improved symptoms, but still having discharge. Pt also reports her UTI has not cleared up after being on the abx. Also reports she began to have a vaginal/anal rash.

## 2021-05-01 NOTE — ED Notes (Signed)
Pt seen by staff leaving ED. Pt left with another patient who left AMA. Did not stay to talk to staff or provider.

## 2021-12-24 IMAGING — DX DG CHEST 2V
2 series · 2 of 2 positions shown · non-contrast
Comparison: Chest radiograph dated 05/18/2015.

CLINICAL DATA: Chest pain

EXAM:
CHEST - 2 VIEW

[chest pa]
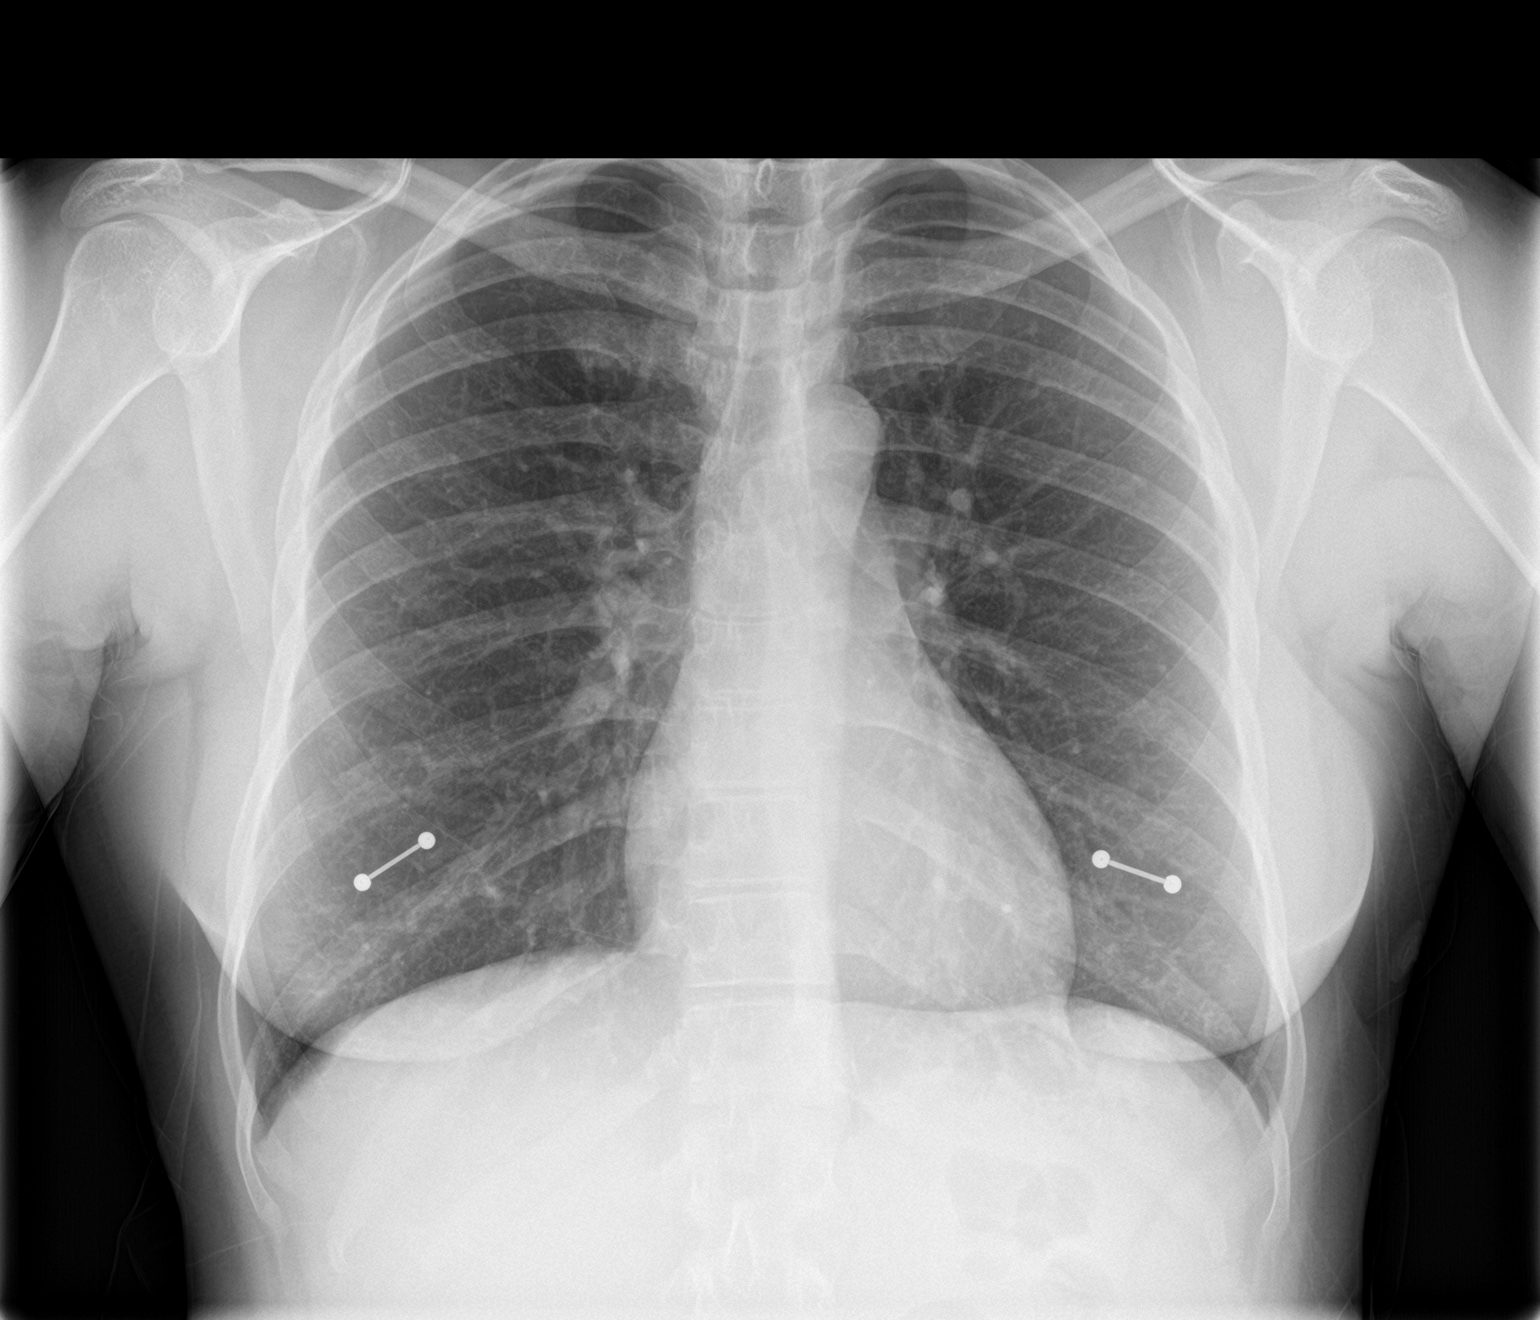

[chest lat]
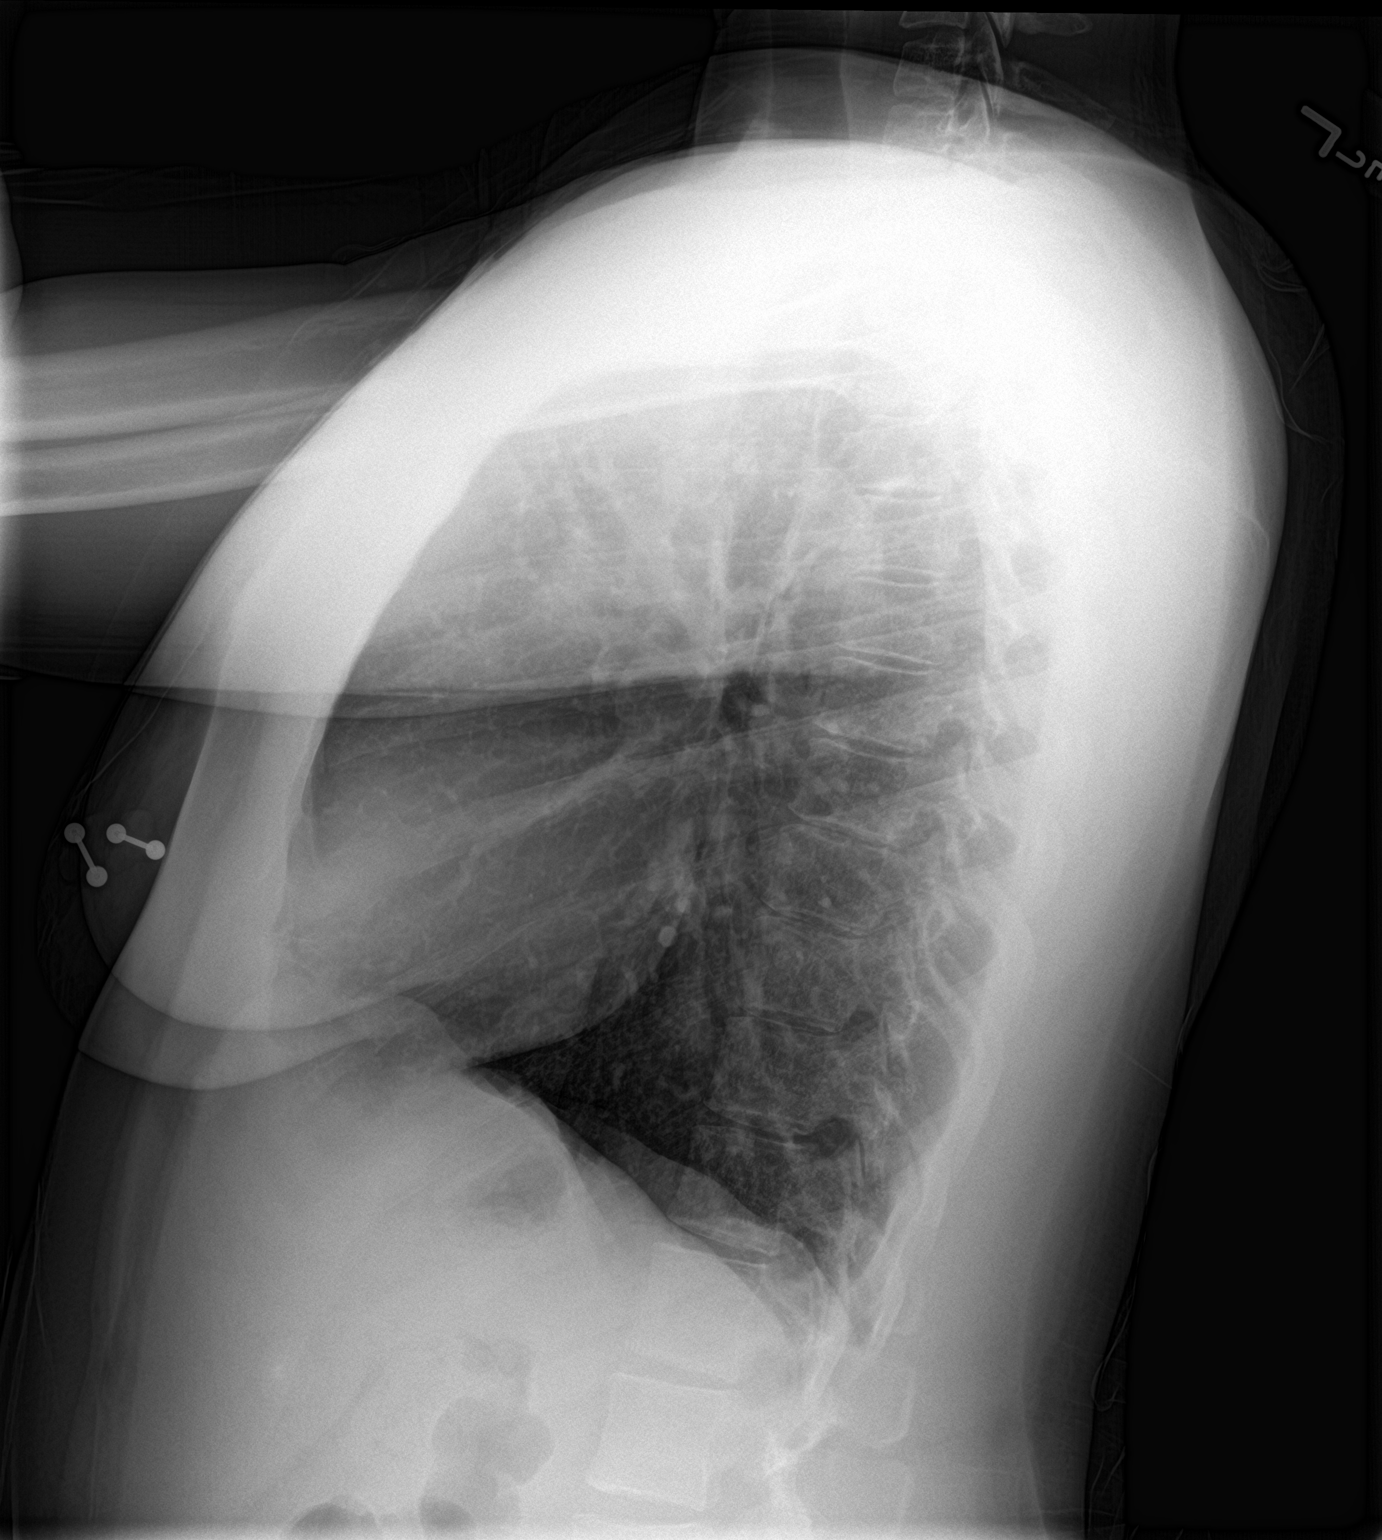

[2 of 2 positions shown; findings below may reference images not displayed]

FINDINGS: The heart size and mediastinal contours are within normal limits.
Both lungs are clear. The visualized skeletal structures are
unremarkable.
IMPRESSION: No active cardiopulmonary disease.

## 2022-07-20 ENCOUNTER — Other Ambulatory Visit: Payer: Self-pay

## 2022-07-20 DIAGNOSIS — N939 Abnormal uterine and vaginal bleeding, unspecified: Secondary | ICD-10-CM | POA: Insufficient documentation

## 2022-07-20 DIAGNOSIS — D649 Anemia, unspecified: Secondary | ICD-10-CM | POA: Diagnosis not present

## 2022-07-20 NOTE — ED Triage Notes (Signed)
Pt has had headache off and on.   Today passed a large clot at 3:15 today.  This is the third day of her period.  She has this with her in her purse.   Concerned about the clot and that she had a headache prior to her cycle, which she has never had.

## 2022-07-21 ENCOUNTER — Emergency Department (HOSPITAL_BASED_OUTPATIENT_CLINIC_OR_DEPARTMENT_OTHER)
Admission: EM | Admit: 2022-07-21 | Discharge: 2022-07-21 | Disposition: A | Discharge status: Home / Self Care | Payer: Medicaid Other | Attending: Emergency Medicine | Admitting: Emergency Medicine

## 2022-07-21 ENCOUNTER — Encounter (HOSPITAL_BASED_OUTPATIENT_CLINIC_OR_DEPARTMENT_OTHER): Payer: Self-pay | Admitting: Emergency Medicine

## 2022-07-21 DIAGNOSIS — D649 Anemia, unspecified: Secondary | ICD-10-CM

## 2022-07-21 DIAGNOSIS — N939 Abnormal uterine and vaginal bleeding, unspecified: Secondary | ICD-10-CM

## 2022-07-21 LAB — CBC WITH DIFFERENTIAL/PLATELET
Abs Immature Granulocytes: 0.01 10*3/uL (ref 0.00–0.07)
Basophils Absolute: 0.1 10*3/uL (ref 0.0–0.1)
Basophils Relative: 1 %
Eosinophils Absolute: 0.1 10*3/uL (ref 0.0–0.5)
Eosinophils Relative: 3 %
HCT: 32.2 % — ABNORMAL LOW (ref 36.0–46.0)
Hemoglobin: 9.6 g/dL — ABNORMAL LOW (ref 12.0–15.0)
Immature Granulocytes: 0 %
Lymphocytes Relative: 51 %
Lymphs Abs: 2.1 10*3/uL (ref 0.7–4.0)
MCH: 22.3 pg — ABNORMAL LOW (ref 26.0–34.0)
MCHC: 29.8 g/dL — ABNORMAL LOW (ref 30.0–36.0)
MCV: 74.7 fL — ABNORMAL LOW (ref 80.0–100.0)
Monocytes Absolute: 0.5 10*3/uL (ref 0.1–1.0)
Monocytes Relative: 11 %
Neutro Abs: 1.4 10*3/uL — ABNORMAL LOW (ref 1.7–7.7)
Neutrophils Relative %: 34 %
Platelets: 263 10*3/uL (ref 150–400)
RBC: 4.31 MIL/uL (ref 3.87–5.11)
RDW: 17.3 % — ABNORMAL HIGH (ref 11.5–15.5)
WBC: 4.1 10*3/uL (ref 4.0–10.5)
nRBC: 0 % (ref 0.0–0.2)

## 2022-07-21 LAB — BASIC METABOLIC PANEL
Anion gap: 3 — ABNORMAL LOW (ref 5–15)
BUN: 12 mg/dL (ref 6–20)
CO2: 25 mmol/L (ref 22–32)
Calcium: 8.5 mg/dL — ABNORMAL LOW (ref 8.9–10.3)
Chloride: 105 mmol/L (ref 98–111)
Creatinine, Ser: 0.76 mg/dL (ref 0.44–1.00)
GFR, Estimated: >60 mL/min (ref >60)
Glucose, Bld: 99 mg/dL (ref 70–99)
Potassium: 3.8 mmol/L (ref 3.5–5.1)
Sodium: 133 mmol/L — ABNORMAL LOW (ref 135–145)

## 2022-07-21 LAB — PREGNANCY, URINE: Preg Test, Ur: NEGATIVE

## 2022-07-21 MED ORDER — ACETAMINOPHEN 325 MG PO TABS: 650.0000 mg | ORAL_TABLET | Freq: Once | ORAL | Status: DC

## 2022-07-21 NOTE — ED Provider Notes (Signed)
[facility]  Provider Note  CSN: [id number] Arrival date & time: 07/20/22 2332  History Chief Complaint  Patient presents with   Vaginal Bleeding    Sarah Murray is a 37 y.o. female who reports she had a headache yesterday just before she started her regular menses. This afternoon she passed a clot from her vagina that was worrying to her because that had never happened before. Her headache has since resolved after drinking some coffee. She reports she was [facility] for [facility] about 2 weeks ago. Has not had pelvic pain.    Home Medications Prior to Admission medications   Medication Sig Start Date End Date Taking? Authorizing Provider  amoxicillin-clavulanate (AUGMENTIN) 875-125 MG tablet Take 1 tablet by mouth 2 (two) times daily.    [provider]  cetirizine (ZYRTEC) 10 MG tablet Take 10 mg by mouth daily.    [provider]  cyclobenzaprine (FLEXERIL) 10 MG tablet Take 1 tablet (10 mg total) by mouth 3 (three) times daily as needed for muscle spasms. 01/01/20   Molpus, John, MD  naproxen (NAPROSYN) 500 MG tablet Take 1 twice daily as needed for pain. 01/01/20   Molpus, John, MD  dicyclomine (BENTYL) 20 MG tablet Take 1 tablet (20 mg total) by mouth 2 (two) times daily. Patient not taking: Reported on 11/24/2015 10/20/15 01/01/20  Palumbo, April, MD  metoCLOPramide (REGLAN) 5 MG tablet Take 1 tablet (5 mg total) by mouth every 8 (eight) hours as needed for nausea (headache). 11/24/15 01/01/20  Junius Creamer, NP     Allergies    Patient has no known allergies.   Review of Systems   Review of Systems Please see HPI for pertinent positives and negatives  Physical Exam BP 122/86 (BP Location: Right Arm)   Pulse 76   Temp 98.2 F (36.8 C) (Oral)   Resp 18   SpO2 99%   Physical Exam Vitals and nursing note reviewed.  Constitutional:      Appearance: Normal appearance.  HENT:     Head:  Normocephalic and atraumatic.     Nose: Nose normal.     Mouth/Throat:     Mouth: Mucous membranes are moist.  Eyes:     Extraocular Movements: Extraocular movements intact.     Conjunctiva/sclera: Conjunctivae normal.  Cardiovascular:     Rate and Rhythm: Normal rate.  Pulmonary:     Effort: Pulmonary effort is normal.     Breath sounds: Normal breath sounds.  Abdominal:     General: Abdomen is flat.     Palpations: Abdomen is soft.     Tenderness: There is no abdominal tenderness.  Musculoskeletal:        General: No swelling. Normal range of motion.     Cervical back: Neck supple.  Skin:    General: Skin is warm and dry.  Neurological:     General: No focal deficit present.     Mental Status: She is alert.  Psychiatric:        Mood and Affect: Mood normal.     ED Results / Procedures / Treatments   EKG None  Procedures Procedures  Medications Ordered in the ED Medications - No data to display  Initial Impression and Plan  Patient here with headache that has resolved and passing a clot during her normal menses. She has had prior tubal and does not think she is pregnant, but will check an HCG to confirm. Check  blood work to ensure no anemia. Patient reassured that passing clots during menstruation is not uncommon and not connected in any way to her headache.   ED Course   Clinical Course as of 07/21/22 0413  Fri Jul 21, 2022  0245 HCG is neg.  [CS]  7654 CBC with anemia at baseline.  [CS]  0410 BMP is normal. Patient reassured, no concerning findings. Recommend she take an iron supplement for her anemia which she states she already has at home. PCP follow up.  [CS]    Clinical Course User Index [CS] Truddie Hidden, MD     MDM Rules/Calculators/A&P Medical Decision Making Problems Addressed: Chronic anemia: chronic illness or injury Vaginal bleeding: acute illness or injury  Amount and/or Complexity of Data Reviewed Labs: ordered. Decision-making  details documented in ED Course.     Final Clinical Impression(s) / ED Diagnoses Final diagnoses:  Vaginal bleeding  Chronic anemia    Rx / DC Orders ED Discharge Orders     None        Truddie Hidden, MD 07/21/22 [phone]

## 2022-12-19 ENCOUNTER — Other Ambulatory Visit: Payer: Self-pay

## 2022-12-19 ENCOUNTER — Encounter (HOSPITAL_BASED_OUTPATIENT_CLINIC_OR_DEPARTMENT_OTHER): Payer: Self-pay | Admitting: Urology

## 2022-12-19 ENCOUNTER — Emergency Department (HOSPITAL_BASED_OUTPATIENT_CLINIC_OR_DEPARTMENT_OTHER)
Admission: EM | Admit: 2022-12-19 | Discharge: 2022-12-19 | Disposition: A | Discharge status: Home / Self Care | Payer: Medicaid Other | Attending: Emergency Medicine | Admitting: Emergency Medicine

## 2022-12-19 ENCOUNTER — Emergency Department (HOSPITAL_BASED_OUTPATIENT_CLINIC_OR_DEPARTMENT_OTHER): Payer: Medicaid Other

## 2022-12-19 DIAGNOSIS — Z87891 Personal history of nicotine dependence: Secondary | ICD-10-CM | POA: Insufficient documentation

## 2022-12-19 DIAGNOSIS — R072 Precordial pain: Secondary | ICD-10-CM

## 2022-12-19 DIAGNOSIS — R079 Chest pain, unspecified: Secondary | ICD-10-CM

## 2022-12-19 DIAGNOSIS — R0789 Other chest pain: Secondary | ICD-10-CM | POA: Diagnosis not present

## 2022-12-19 LAB — BASIC METABOLIC PANEL
Anion gap: 6 (ref 5–15)
BUN: 6 mg/dL (ref 6–20)
CO2: 26 mmol/L (ref 22–32)
Calcium: 8.8 mg/dL — ABNORMAL LOW (ref 8.9–10.3)
Chloride: 103 mmol/L (ref 98–111)
Creatinine, Ser: 0.8 mg/dL (ref 0.44–1.00)
GFR, Estimated: >60 mL/min (ref >60)
Glucose, Bld: 81 mg/dL (ref 70–99)
Potassium: 4 mmol/L (ref 3.5–5.1)
Sodium: 135 mmol/L (ref 135–145)

## 2022-12-19 LAB — CBC
HCT: 35 % — ABNORMAL LOW (ref 36.0–46.0)
Hemoglobin: 11.1 g/dL — ABNORMAL LOW (ref 12.0–15.0)
MCH: 26.2 pg (ref 26.0–34.0)
MCHC: 31.7 g/dL (ref 30.0–36.0)
MCV: 82.7 fL (ref 80.0–100.0)
Platelets: 272 10*3/uL (ref 150–400)
RBC: 4.23 MIL/uL (ref 3.87–5.11)
RDW: 14.4 % (ref 11.5–15.5)
WBC: 3.8 10*3/uL — ABNORMAL LOW (ref 4.0–10.5)
nRBC: 0 % (ref 0.0–0.2)

## 2022-12-19 LAB — PREGNANCY, URINE: Preg Test, Ur: NEGATIVE

## 2022-12-19 LAB — TROPONIN I (HIGH SENSITIVITY): Troponin I (High Sensitivity): 3 ng/L (ref <18)

## 2022-12-19 MED ORDER — ALUM & MAG HYDROXIDE-SIMETH 200-200-20 MG/5ML PO SUSP
15.0000 mL | Freq: Once | ORAL | Status: AC
  Administered 2022-12-19: 15 mL via ORAL
  Filled 2022-12-19: qty 30

## 2022-12-19 MED ORDER — FAMOTIDINE 20 MG PO TABS
20.0000 mg | ORAL_TABLET | Freq: Once | ORAL | Status: AC
Start: 2022-12-19 — End: 2022-12-19
  Administered 2022-12-19: 20 mg via ORAL
  Filled 2022-12-19: qty 1

## 2022-12-19 MED ORDER — IBUPROFEN 400 MG PO TABS
400.0000 mg | ORAL_TABLET | Freq: Once | ORAL | Status: AC
  Administered 2022-12-19: 400 mg via ORAL
  Filled 2022-12-19: qty 1

## 2022-12-19 MED ORDER — IOHEXOL 350 MG/ML SOLN
75.0000 mL | Freq: Once | INTRAVENOUS | Status: AC | PRN
  Administered 2022-12-19: 75 mL via INTRAVENOUS

## 2022-12-19 MED ORDER — ACETAMINOPHEN 500 MG PO TABS
1000.0000 mg | ORAL_TABLET | Freq: Once | ORAL | Status: AC
Start: 2022-12-19 — End: 2022-12-19
  Administered 2022-12-19: 1000 mg via ORAL
  Filled 2022-12-19: qty 2

## 2022-12-19 NOTE — ED Triage Notes (Signed)
Pt states central chest pain that started this am and radiates to her back  on 6/30 had some difficulty and pain with swallowing but that has since resolved

## 2022-12-19 NOTE — ED Provider Notes (Signed)
[facility] Provider Note   CSN: [id number] Arrival date & time: 12/19/22  1637     History  Chief Complaint  Patient presents with   Chest Pain    Sarah Murray is a 37 y.o. female.   Chest Pain   36 year old female presents emergency department with complaints of chest pain with radiation to back.  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  Denies history of similar symptoms in the past.  States that pain has been persistent since onset prompting visit to the emergency department.  Patient states that she was having some difficulty swallowing 3 days ago of which has since resolved.  Denies any shortness of breath, fever, chills, cough, abdominal pain, nausea, vomiting.  Denies any blurred vision, double vision, weakness/sensory deficits of lower extremities, slurred speech, facial droop, gait abnormalities.  No significant pertinent past medical history.  Home Medications Prior to Admission medications   Medication Sig Start Date End Date Taking? Authorizing Provider  amoxicillin-clavulanate (AUGMENTIN) 875-125 MG tablet Take 1 tablet by mouth 2 (two) times daily.    [provider]  cetirizine (ZYRTEC) 10 MG tablet Take 10 mg by mouth daily.    [provider]  cyclobenzaprine (FLEXERIL) 10 MG tablet Take 1 tablet (10 mg total) by mouth 3 (three) times daily as needed for muscle spasms. 01/01/20   Molpus, John, MD  naproxen (NAPROSYN) 500 MG tablet Take 1 twice daily as needed for pain. 01/01/20   Molpus, John, MD  dicyclomine (BENTYL) 20 MG tablet Take 1 tablet (20 mg total) by mouth 2 (two) times daily. Patient not taking: Reported on 11/24/2015 10/20/15 01/01/20  Palumbo, April, MD  metoCLOPramide (REGLAN) 5 MG tablet Take 1 tablet (5 mg total) by mouth every 8 (eight) hours as needed for nausea  (headache). 11/24/15 01/01/20  Earley Favor, NP      Allergies    Patient has no known allergies.    Review of Systems   Review of Systems  Cardiovascular:  Positive for chest pain.  All other systems reviewed and are negative.   Physical Exam Updated Vital Signs BP 117/89 (BP Location: Left Arm)   Pulse 70   Temp 98.2 F (36.8 C)   Resp 20   Ht 5\' 5"  (1.651 m)   Wt 74.8 kg   LMP 12/13/2022 (Approximate)   SpO2 100%   BMI 27.46 kg/m  Physical Exam Vitals and nursing note reviewed.  Constitutional:      General: She is not in acute distress.    Appearance: She is well-developed.  HENT:     Head: Normocephalic and atraumatic.  Eyes:     Conjunctiva/sclera: Conjunctivae normal.  Cardiovascular:     Rate and Rhythm: Normal rate and regular rhythm.     Pulses: Normal pulses.     Heart sounds: No murmur heard. Pulmonary:     Effort: Pulmonary effort is normal. No respiratory distress.     Breath sounds: Normal breath sounds. No wheezing, rhonchi or rales.     Comments: Patient with midsternal tenderness to palpation. Chest:     Chest wall: Tenderness present.  Abdominal:     Palpations: Abdomen is soft.     Tenderness: There is no abdominal tenderness.  Musculoskeletal:        General: No swelling.     Cervical back: Neck  supple.     Comments: No midline tenderness of cervical, thoracic, lumbar spine with no obvious step-off deformity noted.  No paraspinal tenderness noted in the cervical or thoracic spine.  Skin:    General: Skin is warm and dry.     Capillary Refill: Capillary refill takes less than 2 seconds.  Neurological:     Mental Status: She is alert.  Psychiatric:        Mood and Affect: Mood normal.     ED Results / Procedures / Treatments   Labs (all labs ordered are listed, but only abnormal results are displayed) Labs Reviewed  BASIC METABOLIC PANEL - Abnormal; Notable for the following components:      Result Value   Calcium 8.8 (*)    All  other components within normal limits  CBC - Abnormal; Notable for the following components:   WBC 3.8 (*)    Hemoglobin 11.1 (*)    HCT 35.0 (*)    All other components within normal limits  PREGNANCY, URINE  TROPONIN I (HIGH SENSITIVITY)    EKG EKG Interpretation Date/Time:  Tuesday December 19 2022 16:46:57 EDT Ventricular Rate:  79 PR Interval:  183 QRS Duration:  91 QT Interval:  402 QTC Calculation: 461 R Axis:   77  Text Interpretation: Sinus rhythm Artifact Confirmed by Cathren Laine ([id number]) on 12/19/2022 4:56:10 PM  Radiology DG Chest 2 View  Result Date: 12/19/2022 CLINICAL DATA:  Chest pain EXAM: CHEST - 2 VIEW COMPARISON:  01/20/2021 FINDINGS: The heart size and mediastinal contours are within normal limits. Both lungs are clear. The visualized skeletal structures are unremarkable. IMPRESSION: No active cardiopulmonary disease. Electronically Signed   By: Jasmine Pang M.D.   On: 12/19/2022 17:20    Procedures Procedures    Medications Ordered in ED Medications  alum & mag hydroxide-simeth (MAALOX/MYLANTA) 200-200-20 MG/5ML suspension 15 mL (15 mLs Oral Given 12/19/22 1727)  famotidine (PEPCID) tablet 20 mg (20 mg Oral Given 12/19/22 1726)  iohexol (OMNIPAQUE) 350 MG/ML injection 75 mL (75 mLs Intravenous Contrast Given 12/19/22 1756)    ED Course/ Medical Decision Making/ A&P                             Medical Decision Making Amount and/or Complexity of Data Reviewed Labs: ordered. Radiology: ordered.  Risk OTC drugs. Prescription drug management.   This patient presents to the ED for concern of chest pain, this involves an extensive number of treatment options, and is a complaint that carries with it a high risk of complications and morbidity.  The differential diagnosis includes GERD, ACS, PE, aortic dissection, pericarditis/myocarditis/tamponade, pneumothorax, pneumonia, CHF   Co morbidities that complicate the patient evaluation  See HPI   Additional  history obtained:  Additional history obtained from EMR External records from outside source obtained and reviewed including hospital records   Lab Tests:  I Ordered, and personally interpreted labs.  The pertinent results include: Leukopenia of 3.8.  Anemia with a hemoglobin of 11.1.  Platelets within normal range.  Mild hypocalcemia of 8.8 but otherwise, electrolytes within normal limits.  No renal dysfunction.  Urine pregnancy negative.  Initial troponin of 3   Imaging Studies ordered:  I ordered imaging studies including chest x-ray, CT angio chest I independently visualized and interpreted imaging which showed  Chest x-ray: No acute cardiopulmonary abnormalities CT angio chest: Pending I agree with the radiologist interpretation   Cardiac Monitoring: / EKG:  The patient was maintained on a cardiac monitor.  I personally viewed and interpreted the cardiac monitored which showed an underlying rhythm of: Sinus rhythm   Consultations Obtained:  N/a   Problem List / ED Course / Critical interventions / Medication management  Chest pain I ordered medication including Pepcid, Maalox   Reevaluation of the patient after these medicines showed that the patient improved I have reviewed the patients home medicines and have made adjustments as needed   Social Determinants of Health:  Former cigarette use.  Denies illicit drug use.   Test / Admission - Considered:  Chest pain Vitals signs within normal range and stable throughout visit. Laboratory/imaging studies significant for: See above 37 year old female presents emergency department with complaints of chest pain.  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  Given sharp nature of pain with radiation to thoracic back despite mechanism injury lack of concerning hypertension, some concern for aortic dissection so CT imaging of aorta was ordered of which  results are pending..  Low suspicion for ACS given reproducible pain on exam, negative troponin and lack of acute ischemic changes on EKG. given 3+ hours since symptom onset, second troponin deemed unnecessary.  Patient with heart score of 0-3.  Doubt PE.  Patient with Wells PE score of 0 and PERC negative.  Patient's symptoms could be secondary to GERD/esophagitis given relief with GI cocktail.  Symptoms could also be concurrently musculoskeletal in origin given symptoms beginning while lifting objects earlier with reproducible anterior chest wall tenderness on exam although this does not necessarily explain radiation of pain to thoracic back.  Awaiting CT angio chest resulting for assessment of aorta for rule out dissection.  At shift change, patient care handoff to Dr. Denton Lank.  Patient stable upon shift change.         Final Clinical Impression(s) / ED Diagnoses Final diagnoses:  Chest pain, unspecified type    Rx / DC Orders ED Discharge Orders     None         Peter Garter, [place] 12/19/22 1854    Cathren Laine, MD 12/20/22 [id number]

## 2022-12-19 NOTE — Discharge Instructions (Addendum)
It was our pleasure to provide your ER care today - we hope that you feel better.  Take acetaminophen or ibuprofen as need.  If GI symptoms, you may also take pepcid or maalox as need.   Follow up with primary care doctor/cardiologist in the next 1-2 weeks.   Return to ER if worse, new symptoms, fevers, recurrent/persistent chest pain, increased trouble breathing, or other concern.

## 2023-02-21 ENCOUNTER — Encounter: Payer: Self-pay | Admitting: Cardiovascular Disease

## 2023-02-21 ENCOUNTER — Ambulatory Visit: Payer: Medicaid Other | Attending: Cardiovascular Disease | Admitting: Cardiovascular Disease

## 2023-02-21 VITALS — BP 124/72 | HR 55 | Ht 65.25 in | Wt 165.0 lb

## 2023-02-21 DIAGNOSIS — R0789 Other chest pain: Secondary | ICD-10-CM

## 2023-02-21 DIAGNOSIS — R072 Precordial pain: Secondary | ICD-10-CM

## 2023-02-21 NOTE — Progress Notes (Signed)
02/21/2023 Roosvelt Harps   March 19, 1986  [id number]  Primary Physician Pcp, No Primary Cardiologist: Runell Gess MD Nicholes Calamity, [place]  HPI:  Sarah Murray is a 37 y.o. fit appearing divorced/widowed African-American female mother of 3 children, grandmother 2 no grandchildren he works as a Music therapist.  She was referred by the ER where she presented 12/19/2022 with atypical chest pain.  She lifts heavy objects at work.  Her evaluation was unremarkable.  Her troponins were negative.  EKG showed no acute changes.  Chest CT showed no evidence of pulmonary embolus, dissection or calcification.  Pain resolved spontaneously.  She has had no recurrent symptoms.  She is fairly active and jogs on a regular basis.   Current Meds  Medication Sig   [DISCONTINUED] amoxicillin-clavulanate (AUGMENTIN) 875-125 MG tablet Take 1 tablet by mouth 2 (two) times daily.   [DISCONTINUED] cetirizine (ZYRTEC) 10 MG tablet Take 10 mg by mouth daily.   [DISCONTINUED] cyclobenzaprine (FLEXERIL) 10 MG tablet Take 1 tablet (10 mg total) by mouth 3 (three) times daily as needed for muscle spasms.   [DISCONTINUED] naproxen (NAPROSYN) 500 MG tablet Take 1 twice daily as needed for pain.     No Known Allergies  Social History   Socioeconomic History   Marital status: Single    Spouse name: Not on file   Number of children: Not on file   Years of education: Not on file   Highest education level: Not on file  Occupational History   Not on file  Tobacco Use   Smoking status: Former    Current packs/day: 0.50    Types: Cigars, Cigarettes   Smokeless tobacco: Never  Vaping Use   Vaping status: Never Used  Substance and Sexual Activity   Alcohol use: No   Drug use: Not Currently    Types: Marijuana   Sexual activity: Never    Birth control/protection: None, Surgical  Other Topics Concern   Not on file  Social History Narrative   Not on file   Social Determinants of Health   Financial Resource  Strain: Not on file  Food Insecurity: Not on file  Transportation Needs: Not on file  Physical Activity: Not on file  Stress: Not on file  Social Connections: Not on file  Intimate Partner Violence: Not on file     Review of Systems: General: negative for chills, fever, night sweats or weight changes.  Cardiovascular: negative for chest pain, dyspnea on exertion, edema, orthopnea, palpitations, paroxysmal nocturnal dyspnea or shortness of breath Dermatological: negative for rash Respiratory: negative for cough or wheezing Urologic: negative for hematuria Abdominal: negative for nausea, vomiting, diarrhea, bright red blood per rectum, melena, or hematemesis Neurologic: negative for visual changes, syncope, or dizziness All other systems reviewed and are otherwise negative except as noted above.    Blood pressure 124/72, pulse (!) 55, height 5' 5.25" (1.657 m), weight 165 lb (74.8 kg), SpO2 99%.  General appearance: alert and no distress Neck: no adenopathy, no carotid bruit, no JVD, supple, symmetrical, trachea midline, and thyroid not enlarged, symmetric, no tenderness/mass/nodules Lungs: clear to auscultation bilaterally Heart: regular rate and rhythm, S1, S2 normal, no murmur, click, rub or gallop Extremities: extremities normal, atraumatic, no cyanosis or edema Pulses: 2+ and symmetric Skin: Skin color, texture, turgor normal. No rashes or lesions Neurologic: Grossly normal  EKG EKG Interpretation Date/Time:  Wednesday February 21 2023 10:52:21 EDT Ventricular Rate:  55 PR Interval:  168 QRS Duration:  86 QT  Interval:  450 QTC Calculation: 430 R Axis:   78  Text Interpretation: Sinus bradycardia with sinus arrhythmia When compared with ECG of 19-Dec-2022 16:46, PREVIOUS ECG IS PRESENT Confirmed by Nanetta Batty [id number]) on 02/21/2023 11:15:05 AM    ASSESSMENT AND PLAN:   Atypical chest pain Patrece was referred by the ER where she presented on 12/19/2022 with atypical  chest pain.  Her symptoms sound musculoskeletal.  Her enzymes were negative.  Chest CTA was negative for PE or dissection.  There is no mention of coronary calcification.  The pain resolved spontaneously.  She has had no recurrent symptoms.  I am going to get coronary calcium score to further evaluate.     Runell Gess MD FACP,FACC,FAHA, [facility] 02/21/2023 11:24 AM

## 2023-02-21 NOTE — Patient Instructions (Signed)
Medication Instructions:  Your physician recommends that you continue on your current medications as directed. Please refer to the Current Medication list given to you today.  *If you need a refill on your cardiac medications before your next appointment, please call your pharmacy*   Lab Work: Your physician recommends that you return for lab work in: the next week or 2 for FASTING lipid/liver panel.  If you have labs (blood work) drawn today and your tests are completely normal, you will receive your results only by: MyChart Message (if you have MyChart) OR A paper copy in the mail If you have any lab test that is abnormal or we need to change your treatment, we will call you to review the results.   Testing/Procedures: Dr. Allyson Sabal has ordered a CT coronary calcium score.   Test locations:  [facility]  [facility]  This is $99 out of pocket.   Coronary CalciumScan A coronary calcium scan is an imaging test used to look for deposits of calcium and other fatty materials (plaques) in the inner lining of the blood vessels of the heart (coronary arteries). These deposits of calcium and plaques can partly clog and narrow the coronary arteries without producing any symptoms or warning signs. This puts a person at risk for a heart attack. This test can detect these deposits before symptoms develop. Tell a health care provider about: Any allergies you have. All medicines you are taking, including vitamins, herbs, eye drops, creams, and over-the-counter medicines. Any problems you or family members have had with anesthetic medicines. Any blood disorders you have. Any surgeries you have had. Any medical conditions you have. Whether you are pregnant or may be pregnant. What are the risks? Generally, this is a safe procedure. However, problems may occur, including: Harm to a pregnant woman and her  unborn baby. This test involves the use of radiation. Radiation exposure can be dangerous to a pregnant woman and her unborn baby. If you are pregnant, you generally should not have this procedure done. Slight increase in the risk of cancer. This is because of the radiation involved in the test. What happens before the procedure? No preparation is needed for this procedure. What happens during the procedure? You will undress and remove any jewelry around your neck or chest. You will put on a hospital gown. Sticky electrodes will be placed on your chest. The electrodes will be connected to an electrocardiogram (ECG) machine to record a tracing of the electrical activity of your heart. A CT scanner will take pictures of your heart. During this time, you will be asked to lie still and hold your breath for 2-3 seconds while a picture of your heart is being taken. The procedure may vary among health care providers and hospitals. What happens after the procedure? You can get dressed. You can return to your normal activities. It is up to you to get the results of your test. Ask your health care provider, or the department that is doing the test, when your results will be ready. Summary A coronary calcium scan is an imaging test used to look for deposits of calcium and other fatty materials (plaques) in the inner lining of the blood vessels of the heart (coronary arteries). Generally, this is a safe procedure. Tell your health care provider if you are pregnant or may be pregnant. No preparation is needed for this procedure. A CT scanner will take pictures of  your heart. You can return to your normal activities after the scan is done. This information is not intended to replace advice given to you by your health care provider. Make sure you discuss any questions you have with your health care provider. Document Released: 12/02/2007 Document Revised: 04/24/2016 Document Reviewed: 04/24/2016 Elsevier  Interactive Patient Education  2017 [employer].    Follow-Up: At [facility], you and your health needs are our priority.  As part of our continuing mission to provide you with exceptional heart care, we have created designated Provider Care Teams.  These Care Teams include your primary Cardiologist (physician) and Advanced Practice Providers (APPs -  Physician Assistants and Nurse Practitioners) who all work together to provide you with the care you need, when you need it.  We recommend signing up for the patient portal called "MyChart".  Sign up information is provided on this After Visit Summary.  MyChart is used to connect with patients for Virtual Visits (Telemedicine).  Patients are able to view lab/test results, encounter notes, upcoming appointments, etc.  Non-urgent messages can be sent to your provider as well.   To learn more about what you can do with MyChart, go to [email or web address].    Your next appointment:   We will see you on an as needed basis.  Provider:   Nanetta Batty, MD

## 2023-02-21 NOTE — Assessment & Plan Note (Signed)
Sarah Murray was referred by the ER where she presented on 12/19/2022 with atypical chest pain.  Her symptoms sound musculoskeletal.  Her enzymes were negative.  Chest CTA was negative for PE or dissection.  There is no mention of coronary calcification.  The pain resolved spontaneously.  She has had no recurrent symptoms.  I am going to get coronary calcium score to further evaluate.

## 2023-03-19 ENCOUNTER — Ambulatory Visit (HOSPITAL_BASED_OUTPATIENT_CLINIC_OR_DEPARTMENT_OTHER)
Admission: RE | Admit: 2023-03-19 | Discharge: 2023-03-19 | Disposition: A | Discharge status: Home / Self Care | Payer: Medicaid Other | Source: Ambulatory Visit | Attending: Cardiovascular Disease | Admitting: Cardiovascular Disease

## 2023-03-19 DIAGNOSIS — R0789 Other chest pain: Secondary | ICD-10-CM | POA: Insufficient documentation

## 2023-03-19 DIAGNOSIS — R072 Precordial pain: Secondary | ICD-10-CM | POA: Insufficient documentation

## 2023-09-11 ENCOUNTER — Emergency Department (HOSPITAL_BASED_OUTPATIENT_CLINIC_OR_DEPARTMENT_OTHER)
Admission: EM | Admit: 2023-09-11 | Discharge: 2023-09-11 | Disposition: A | Discharge status: Home / Self Care | Attending: Emergency Medicine | Admitting: Emergency Medicine

## 2023-09-11 ENCOUNTER — Encounter (HOSPITAL_BASED_OUTPATIENT_CLINIC_OR_DEPARTMENT_OTHER): Payer: Self-pay | Admitting: Emergency Medicine

## 2023-09-11 ENCOUNTER — Other Ambulatory Visit: Payer: Self-pay

## 2023-09-11 DIAGNOSIS — E860 Dehydration: Secondary | ICD-10-CM | POA: Diagnosis not present

## 2023-09-11 DIAGNOSIS — A048 Other specified bacterial intestinal infections: Secondary | ICD-10-CM

## 2023-09-11 DIAGNOSIS — B9681 Helicobacter pylori [H. pylori] as the cause of diseases classified elsewhere: Secondary | ICD-10-CM | POA: Insufficient documentation

## 2023-09-11 DIAGNOSIS — R519 Headache, unspecified: Secondary | ICD-10-CM | POA: Diagnosis present

## 2023-09-11 LAB — COMPREHENSIVE METABOLIC PANEL
ALT: 37 U/L (ref 0–44)
AST: 31 U/L (ref 15–41)
Albumin: 3.4 g/dL — ABNORMAL LOW (ref 3.5–5.0)
Alkaline Phosphatase: 51 U/L (ref 38–126)
Anion gap: 7 (ref 5–15)
BUN: 7 mg/dL (ref 6–20)
CO2: 23 mmol/L (ref 22–32)
Calcium: 9.1 mg/dL (ref 8.9–10.3)
Chloride: 106 mmol/L (ref 98–111)
Creatinine, Ser: 0.6 mg/dL (ref 0.44–1.00)
GFR, Estimated: >60 mL/min (ref >60)
Glucose, Bld: 100 mg/dL — ABNORMAL HIGH (ref 70–99)
Potassium: 3.5 mmol/L (ref 3.5–5.1)
Sodium: 136 mmol/L (ref 135–145)
Total Bilirubin: 0.5 mg/dL (ref 0.0–1.2)
Total Protein: 7.3 g/dL (ref 6.5–8.1)

## 2023-09-11 LAB — CBC WITH DIFFERENTIAL/PLATELET
Abs Immature Granulocytes: 0.01 10*3/uL (ref 0.00–0.07)
Basophils Absolute: 0.1 10*3/uL (ref 0.0–0.1)
Basophils Relative: 1 %
Eosinophils Absolute: 0 10*3/uL (ref 0.0–0.5)
Eosinophils Relative: 0 %
HCT: 30.9 % — ABNORMAL LOW (ref 36.0–46.0)
Hemoglobin: 9.6 g/dL — ABNORMAL LOW (ref 12.0–15.0)
Immature Granulocytes: 0 %
Lymphocytes Relative: 19 %
Lymphs Abs: 0.9 10*3/uL (ref 0.7–4.0)
MCH: 23 pg — ABNORMAL LOW (ref 26.0–34.0)
MCHC: 31.1 g/dL (ref 30.0–36.0)
MCV: 74.1 fL — ABNORMAL LOW (ref 80.0–100.0)
Monocytes Absolute: 0.3 10*3/uL (ref 0.1–1.0)
Monocytes Relative: 6 %
Neutro Abs: 3.4 10*3/uL (ref 1.7–7.7)
Neutrophils Relative %: 74 %
Platelets: 260 10*3/uL (ref 150–400)
RBC: 4.17 MIL/uL (ref 3.87–5.11)
RDW: 16.7 % — ABNORMAL HIGH (ref 11.5–15.5)
WBC: 4.6 10*3/uL (ref 4.0–10.5)
nRBC: 0 % (ref 0.0–0.2)

## 2023-09-11 LAB — URINALYSIS, MICROSCOPIC (REFLEX): WBC, UA: NONE SEEN WBC/hpf (ref 0–5)

## 2023-09-11 LAB — URINALYSIS, ROUTINE W REFLEX MICROSCOPIC
Bilirubin Urine: NEGATIVE
Glucose, UA: NEGATIVE mg/dL
Ketones, ur: NEGATIVE mg/dL
Leukocytes,Ua: NEGATIVE
Nitrite: NEGATIVE
Protein, ur: NEGATIVE mg/dL
Specific Gravity, Urine: 1.02 (ref 1.005–1.030)
pH: 7 (ref 5.0–8.0)

## 2023-09-11 LAB — LIPASE, BLOOD: Lipase: 22 U/L (ref 11–51)

## 2023-09-11 LAB — PREGNANCY, URINE: Preg Test, Ur: NEGATIVE

## 2023-09-11 MED ORDER — KETOROLAC TROMETHAMINE 30 MG/ML IJ SOLN
30.0000 mg | Freq: Once | INTRAMUSCULAR | Status: AC
  Administered 2023-09-11: 30 mg via INTRAVENOUS
  Filled 2023-09-11: qty 1

## 2023-09-11 MED ORDER — SODIUM CHLORIDE 0.9 % IV BOLUS
1000.0000 mL | Freq: Once | INTRAVENOUS | Status: AC
  Administered 2023-09-11: 1000 mL via INTRAVENOUS

## 2023-09-11 NOTE — ED Triage Notes (Signed)
 Pt reports having left sided flank pain this morning after having a bowel movement, reports hx of GI issues and is taking Amoxicillin & Doxy, reports adherence to ABT  Also c/o HA, n/v, and dizziness that started at 0933 this AM; denies hx of migraines

## 2023-09-11 NOTE — ED Provider Notes (Signed)
 [place] [facility] Provider Note   CSN: [id number] Arrival date & time: 09/11/23  1705     History  Chief Complaint  Patient presents with   Headache    Sarah Murray is a 38 y.o. female.  Pt is a 38 yo female with pmhx significant for recent diagnosis of H. Pylori.  Pt is currently taking doxy, zithromax, probiotics and prilosec.  Today, she developed a severe headache that is not going away.  She also has some abd pain.  She has had some diarrhea since starting the meds.  No fevers.        Home Medications Prior to Admission medications   Medication Sig Start Date End Date Taking? Authorizing Provider  dicyclomine (BENTYL) 20 MG tablet Take 1 tablet (20 mg total) by mouth 2 (two) times daily. Patient not taking: Reported on 11/24/2015 10/20/15 01/01/20  Palumbo, April, MD  metoCLOPramide (REGLAN) 5 MG tablet Take 1 tablet (5 mg total) by mouth every 8 (eight) hours as needed for nausea (headache). 11/24/15 01/01/20  Earley Favor, NP      Allergies    Patient has no known allergies.    Review of Systems   Review of Systems  Gastrointestinal:  Positive for abdominal pain and diarrhea.  Neurological:  Positive for headaches.  All other systems reviewed and are negative.   Physical Exam Updated Vital Signs BP 131/88 (BP Location: Right Arm)   Pulse (!) 57   Temp 98 F (36.7 C) (Oral)   Resp 17   Ht 5\' 5"  (1.651 m)   Wt 80.7 kg   LMP 08/28/2023 (Approximate)   SpO2 100%   BMI 29.62 kg/m  Physical Exam Vitals and nursing note reviewed.  Constitutional:      Appearance: She is well-developed.  HENT:     Head: Normocephalic and atraumatic.     Mouth/Throat:     Mouth: Mucous membranes are moist.     Pharynx: Oropharynx is clear.  Eyes:     Extraocular Movements: Extraocular movements intact.     Pupils: Pupils are equal, round, and reactive to light.  Cardiovascular:     Rate and Rhythm: Normal rate and regular rhythm.      Heart sounds: Normal heart sounds.  Pulmonary:     Effort: Pulmonary effort is normal.     Breath sounds: Normal breath sounds.  Abdominal:     General: Bowel sounds are normal.     Palpations: Abdomen is soft.  Musculoskeletal:        General: Normal range of motion.     Cervical back: Normal range of motion and neck supple.  Skin:    General: Skin is warm and dry.  Neurological:     Mental Status: She is alert and oriented to person, place, and time.  Psychiatric:        Mood and Affect: Mood normal.        Speech: Speech normal.        Behavior: Behavior normal.     ED Results / Procedures / Treatments   Labs (all labs ordered are listed, but only abnormal results are displayed) Labs Reviewed  CBC WITH DIFFERENTIAL/PLATELET - Abnormal; Notable for the following components:      Result Value   Hemoglobin 9.6 (*)    HCT 30.9 (*)    MCV 74.1 (*)    MCH 23.0 (*)    RDW 16.7 (*)    All other components within normal  limits  COMPREHENSIVE METABOLIC PANEL - Abnormal; Notable for the following components:   Glucose, Bld 100 (*)    Albumin 3.4 (*)    All other components within normal limits  URINALYSIS, ROUTINE W REFLEX MICROSCOPIC - Abnormal; Notable for the following components:   Hgb urine dipstick MODERATE (*)    All other components within normal limits  URINALYSIS, MICROSCOPIC (REFLEX) - Abnormal; Notable for the following components:   Bacteria, UA RARE (*)    All other components within normal limits  LIPASE, BLOOD  PREGNANCY, URINE    EKG None  Radiology No results found.  Procedures Procedures    Medications Ordered in ED Medications  sodium chloride 0.9 % bolus 1,000 mL (0 mLs Intravenous Stopped 09/11/23 1902)  ketorolac (TORADOL) 30 MG/ML injection 30 mg (30 mg Intravenous Given 09/11/23 1741)    ED Course/ Medical Decision Making/ A&P                                 Medical Decision Making Amount and/or Complexity of Data Reviewed Labs:  ordered.  Risk Prescription drug management.   This patient presents to the ED for concern of headache and abd pain, this involves an extensive number of treatment options, and is a complaint that carries with it a high risk of complications and morbidity.  The differential diagnosis includes dehydration, electrolyte abn, h.pylori, infection   Co morbidities that complicate the patient evaluation  H pylori   Additional history obtained:  Additional history obtained from epic chart review  Lab Tests:  I Ordered, and personally interpreted labs.  The pertinent results include:  cbc with hgb low at 9.6 (stable); cmp nl; lip 22  Medicines ordered and prescription drug management:  I ordered medication including ivfs/toradol  for headache  Reevaluation of the patient after these medicines showed that the patient improved I have reviewed the patients home medicines and have made adjustments as needed   Test Considered:  ct   Critical Interventions:  ivfs  Problem List / ED Course:  Headache:  improved with fluids/toradol.  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  She also has had some diarrhea.  So headache was likely from dehydration and lack of sleep.  Abd pain:  likely due to h.pylori.  Pt is to continue current meds.   Reevaluation:  After the interventions noted above, I reevaluated the patient and found that they have :improved   Social Determinants of Health:  Lives at home   Dispostion:  After consideration of the diagnostic results and the patients response to treatment, I feel that the patent would benefit from discharge with outpatient f/u.          Final Clinical Impression(s) / ED Diagnoses Final diagnoses:  Dehydration  H. pylori infection    Rx / DC Orders ED Discharge Orders     None         Jacalyn Lefevre, MD 09/11/23 2018

## 2023-11-16 ENCOUNTER — Encounter (HOSPITAL_BASED_OUTPATIENT_CLINIC_OR_DEPARTMENT_OTHER): Payer: Self-pay | Admitting: Emergency Medicine

## 2023-11-16 ENCOUNTER — Emergency Department (HOSPITAL_BASED_OUTPATIENT_CLINIC_OR_DEPARTMENT_OTHER)

## 2023-11-16 ENCOUNTER — Emergency Department (HOSPITAL_BASED_OUTPATIENT_CLINIC_OR_DEPARTMENT_OTHER)
Admission: EM | Admit: 2023-11-16 | Discharge: 2023-11-16 | Disposition: A | Discharge status: Home / Self Care | Attending: Emergency Medicine | Admitting: Emergency Medicine

## 2023-11-16 ENCOUNTER — Other Ambulatory Visit: Payer: Self-pay

## 2023-11-16 DIAGNOSIS — R0789 Other chest pain: Secondary | ICD-10-CM | POA: Diagnosis not present

## 2023-11-16 DIAGNOSIS — R519 Headache, unspecified: Secondary | ICD-10-CM | POA: Insufficient documentation

## 2023-11-16 DIAGNOSIS — N644 Mastodynia: Secondary | ICD-10-CM | POA: Diagnosis not present

## 2023-11-16 LAB — BASIC METABOLIC PANEL WITH GFR
Anion gap: 12 (ref 5–15)
BUN: 9 mg/dL (ref 6–20)
CO2: 22 mmol/L (ref 22–32)
Calcium: 8.9 mg/dL (ref 8.9–10.3)
Chloride: 101 mmol/L (ref 98–111)
Creatinine, Ser: 0.74 mg/dL (ref 0.44–1.00)
GFR, Estimated: >60 mL/min (ref >60)
Glucose, Bld: 95 mg/dL (ref 70–99)
Potassium: 3.8 mmol/L (ref 3.5–5.1)
Sodium: 134 mmol/L — ABNORMAL LOW (ref 135–145)

## 2023-11-16 LAB — CBC
HCT: 34.4 % — ABNORMAL LOW (ref 36.0–46.0)
Hemoglobin: 10.7 g/dL — ABNORMAL LOW (ref 12.0–15.0)
MCH: 23.9 pg — ABNORMAL LOW (ref 26.0–34.0)
MCHC: 31.1 g/dL (ref 30.0–36.0)
MCV: 76.8 fL — ABNORMAL LOW (ref 80.0–100.0)
Platelets: 232 10*3/uL (ref 150–400)
RBC: 4.48 MIL/uL (ref 3.87–5.11)
RDW: 18.3 % — ABNORMAL HIGH (ref 11.5–15.5)
WBC: 12.6 10*3/uL — ABNORMAL HIGH (ref 4.0–10.5)
nRBC: 0 % (ref 0.0–0.2)

## 2023-11-16 LAB — HEPATIC FUNCTION PANEL
ALT: 12 U/L (ref 0–44)
AST: 17 U/L (ref 15–41)
Albumin: 4 g/dL (ref 3.5–5.0)
Alkaline Phosphatase: 59 U/L (ref 38–126)
Bilirubin, Direct: 0.2 mg/dL (ref 0.0–0.2)
Indirect Bilirubin: 0.3 mg/dL (ref 0.3–0.9)
Total Bilirubin: 0.5 mg/dL (ref 0.0–1.2)
Total Protein: 7.6 g/dL (ref 6.5–8.1)

## 2023-11-16 LAB — TROPONIN T, HIGH SENSITIVITY: Troponin T High Sensitivity: <15 ng/L (ref <19)

## 2023-11-16 LAB — D-DIMER, QUANTITATIVE: D-Dimer, Quant: <0.27 ug{FEU}/mL (ref 0.00–0.50)

## 2023-11-16 LAB — LIPASE, BLOOD: Lipase: 15 U/L (ref 11–51)

## 2023-11-16 MED ORDER — AMOXICILLIN-POT CLAVULANATE 875-125 MG PO TABS
1.0000 | ORAL_TABLET | Freq: Once | ORAL | Status: AC
  Administered 2023-11-16: 1 via ORAL
  Filled 2023-11-16: qty 1

## 2023-11-16 MED ORDER — AMOXICILLIN-POT CLAVULANATE 875-125 MG PO TABS: 1.0000 | ORAL_TABLET | Freq: Two times a day (BID) | ORAL | 0 refills | Status: AC

## 2023-11-16 NOTE — ED Provider Notes (Signed)
 [place] [facility] Provider Note   CSN: [id number] Arrival date & time: 11/16/23  1843     History  Chief Complaint  Patient presents with   Chest Pain   Headache    Sarah Murray is a 38 y.o. female.  Patient here for we will see for left-sided breast pain.  She had a mild headache this morning got lightheaded.  Mild headache now but feeling better after taking ibuprofen .  She is having pain when she takes a deep breath.  But most of the pain is in her left breast tissue.  She is not breast-feeding.  She states she cannot get pregnant from surgery that she had.  She denies any drainage from her nipple.  She denies any fever or chills but she says that her breast tissue was very warm.  No recent surgery or travel.  No history of blood clots.  No weakness numbness tingling.  Really no headache now.  No vision changes.  The history is provided by the patient.       Home Medications Prior to Admission medications   Medication Sig Start Date End Date Taking? Authorizing Provider  amoxicillin-clavulanate (AUGMENTIN) 875-125 MG tablet Take 1 tablet by mouth every 12 (twelve) hours. 11/16/23  Yes Kora Groom, DO  dicyclomine  (BENTYL ) 20 MG tablet Take 1 tablet (20 mg total) by mouth 2 (two) times daily. Patient not taking: Reported on 11/24/2015 10/20/15 01/01/20  Palumbo, April, MD  metoCLOPramide  (REGLAN ) 5 MG tablet Take 1 tablet (5 mg total) by mouth every 8 (eight) hours as needed for nausea (headache). 11/24/15 01/01/20  Franceen Inches, NP      Allergies    Patient has no known allergies.    Review of Systems   Review of Systems  Physical Exam Updated Vital Signs BP 115/74 (BP Location: Left Arm)   Pulse 86   Temp 98 F (36.7 C) (Oral)   Resp 16   Ht 5\' 5"  (1.651 m)   Wt 81.6 kg   LMP 10/22/2023 (Approximate)   SpO2 100%   BMI 29.95 kg/m  Physical Exam Vitals and nursing note reviewed.  Constitutional:      General: She is not in  acute distress.    Appearance: She is well-developed. She is not ill-appearing.  HENT:     Head: Normocephalic and atraumatic.  Eyes:     Extraocular Movements: Extraocular movements intact.     Conjunctiva/sclera: Conjunctivae normal.     Pupils: Pupils are equal, round, and reactive to light.  Cardiovascular:     Rate and Rhythm: Normal rate and regular rhythm.     Pulses:          Radial pulses are 2+ on the right side and 2+ on the left side.     Heart sounds: Normal heart sounds. No murmur heard. Pulmonary:     Effort: Pulmonary effort is normal. No respiratory distress.     Breath sounds: Normal breath sounds.  Chest:     Comments: With chaperone her breast exam she does have some tenderness of the left breast tissue compared to the right but there is no obvious swelling no mass no fluctuance but there is some warmth Abdominal:     Palpations: Abdomen is soft.     Tenderness: There is no abdominal tenderness.  Musculoskeletal:        General: No swelling. Normal range of motion.     Cervical back: Normal range of motion and  neck supple.     Right lower leg: No edema.     Left lower leg: No edema.  Skin:    General: Skin is warm and dry.     Capillary Refill: Capillary refill takes less than 2 seconds.  Neurological:     General: No focal deficit present.     Mental Status: She is alert and oriented to person, place, and time.     Cranial Nerves: No cranial nerve deficit.     Motor: No weakness.     Comments: 5+ out of 5 strength throughout, normal sensation, no drift, normal finger-nose-finger, normal speech  Psychiatric:        Mood and Affect: Mood normal.     ED Results / Procedures / Treatments   Labs (all labs ordered are listed, but only abnormal results are displayed) Labs Reviewed  BASIC METABOLIC PANEL WITH GFR - Abnormal; Notable for the following components:      Result Value   Sodium 134 (*)    All other components within normal limits  CBC -  Abnormal; Notable for the following components:   WBC 12.6 (*)    Hemoglobin 10.7 (*)    HCT 34.4 (*)    MCV 76.8 (*)    MCH 23.9 (*)    RDW 18.3 (*)    All other components within normal limits  D-DIMER, QUANTITATIVE  HEPATIC FUNCTION PANEL  LIPASE, BLOOD  TROPONIN T, HIGH SENSITIVITY    EKG EKG Interpretation Date/Time:  Friday Nov 16 2023 18:51:00 EDT Ventricular Rate:  86 PR Interval:  157 QRS Duration:  81 QT Interval:  404 QTC Calculation: 484 R Axis:   67  Text Interpretation: Sinus rhythm Abnormal R-wave progression, early transition Borderline T wave abnormalities Confirmed by Sarah Murray [id number]) on 11/16/2023 6:52:35 PM  Radiology CT Head Wo Contrast Result Date: 11/16/2023 EXAM: CT HEAD WITHOUT 11/16/2023 07:28:34 PM TECHNIQUE: CT of the head was performed without the administration of intravenous contrast. Automated exposure control, iterative reconstruction, and/or weight based adjustment of the mA/kV was utilized to reduce the radiation dose to as low as reasonably achievable. COMPARISON: None available. CLINICAL HISTORY: Mental status change, unknown cause. Headache and lightheadedness. FINDINGS: BRAIN AND VENTRICLES: There is no acute intracranial hemorrhage, mass effect or midline shift. No abnormal extra-axial fluid collection. The gray-white differentiation is maintained without evidence of an acute infarct. There is no evidence of hydrocephalus. ORBITS: The visualized portion of the orbits demonstrate no acute abnormality. SINUSES: The visualized paranasal sinuses and mastoid air cells demonstrate no acute abnormality. SOFT TISSUES AND SKULL: No acute abnormality of the visualized skull or soft tissues. IMPRESSION: 1. No acute intracranial abnormality. Electronically signed by: Zadie Herter MD 11/16/2023 08:46 PM EDT RP Workstation: [id number]   DG Chest 2 View Result Date: 11/16/2023 CLINICAL DATA:  chest pain EXAM: CHEST - 2 VIEW COMPARISON:  None available.  FINDINGS: No focal airspace consolidation, pleural effusion, or pneumothorax. No cardiomegaly. No acute fracture or destructive lesion. Bilateral nipple adornments. IMPRESSION: No acute cardiopulmonary abnormality. Electronically Signed   By: Rance Burrows M.D.   On: 11/16/2023 20:31    Procedures Procedures    Medications Ordered in ED Medications  amoxicillin-clavulanate (AUGMENTIN) 875-125 MG per tablet 1 tablet (has no administration in time range)    ED Course/ Medical Decision Making/ A&P  Medical Decision Making Amount and/or Complexity of Data Reviewed Labs: ordered. Radiology: ordered.  Risk Prescription drug management.   Jood Retana is here with chest pain headache.  Mostly issue with the left breast is her main concern today.  Headaches improved with while in triage.  She has had lab work including head CT chest x-ray D-dimer troponin EKG ordered as differential diagnosis includes likely pain from her breast from a mild cellulitis as I have no concern for abscess or mass or major process in the left breast at this time but it is warm compared to the right.  There is no drainage from the nipple.  She has no major cardiac risk factors or PE risk factors.  But lab work was collected to evaluate for these things.  She is neurologically intact.  Lab work shows no significant leukocytosis anemia or electrolyte abnormality.  D-dimer normal.  Troponin normal.  EKG shows sinus rhythm.  No ischemic changes.  Head CT chest x-ray unremarkable.  I reviewed interpreted labs and imaging.  Overall we will treat for may be a developing cellulitis of her left breast.  Recommend close follow-up with her OB/GYN for repeat exam.  Discharged in good condition.  Continue Tylenol  and ibuprofen  at home.  Understands return precautions.  This chart was dictated using voice recognition software.  Despite best efforts to proofread,  errors can occur which can change the  documentation meaning.         Final Clinical Impression(s) / ED Diagnoses Final diagnoses:  Bad headache  Breast pain  Atypical chest pain    Rx / DC Orders ED Discharge Orders          Ordered    amoxicillin -clavulanate (AUGMENTIN ) 875-125 MG tablet  Every 12 hours        11/16/23 2103              Sarah Rue, DO 11/16/23 2104

## 2023-11-16 NOTE — Discharge Instructions (Addendum)
 Take antibiotics as prescribed.  Follow-up with your OB/GYN.  Return if symptoms worsen.  Workup and imaging today is unremarkable.  Take antibiotic dose tomorrow morning for your next dose.  Continue Tylenol  and ibuprofen  for pain.

## 2023-11-16 NOTE — ED Triage Notes (Signed)
 Pt POV steady gait- pt woke up 0600, reported new chest pain started at 1100, took shower which provided some relief. Reports bending down at work and became light headed, now has 8/10 headache, pain with inspiration, poor po intake today.    Pt also reports L breast is swollen and hot to touch, denies nipple discharge.   Took 800 ibuprofen  appx 1300
